# Patient Record
Sex: Female | Born: 1995 | Race: Black or African American | Hispanic: No | Marital: Married | Smoking: Never smoker
Health system: Southern US, Community
[De-identification: ages and names within clinical notes are randomized; demographics above are authoritative.]

## PROBLEM LIST (undated history)

## (undated) ENCOUNTER — Inpatient Hospital Stay (HOSPITAL_COMMUNITY): Payer: Self-pay

## (undated) DIAGNOSIS — F909 Attention-deficit hyperactivity disorder, unspecified type: Secondary | ICD-10-CM

## (undated) HISTORY — PX: MOUTH SURGERY: SHX715

## (undated) HISTORY — PX: COLONOSCOPY: SHX174

## (undated) HISTORY — PX: TUBAL LIGATION: SHX77

## (undated) HISTORY — PX: UPPER GI ENDOSCOPY: SHX6162

---

## 2016-12-25 LAB — GLUCOSE TOLERANCE, 1 HOUR (50G) W/O FASTING: Glucose, 1 Hour GTT: 108

## 2016-12-25 LAB — CYTOLOGY - PAP: PAP SMEAR: NEGATIVE

## 2016-12-26 LAB — OB RESULTS CONSOLE ANTIBODY SCREEN: Antibody Screen: NEGATIVE

## 2016-12-26 LAB — OB RESULTS CONSOLE ABO/RH: RH TYPE: POSITIVE

## 2016-12-26 LAB — OB RESULTS CONSOLE GC/CHLAMYDIA
Chlamydia: NEGATIVE
Gonorrhea: NEGATIVE

## 2016-12-26 LAB — OB RESULTS CONSOLE RUBELLA ANTIBODY, IGM: RUBELLA: IMMUNE

## 2016-12-26 LAB — OB RESULTS CONSOLE HGB/HCT, BLOOD
HCT: 37 %
Hemoglobin: 12.1 g/dL

## 2016-12-26 LAB — OB RESULTS CONSOLE TSH: TSH: 1068

## 2016-12-26 LAB — OB RESULTS CONSOLE PLATELET COUNT: Platelets: 358 10*3/uL

## 2016-12-26 LAB — OB RESULTS CONSOLE HIV ANTIBODY (ROUTINE TESTING): HIV: NONREACTIVE

## 2016-12-26 LAB — OB RESULTS CONSOLE HEPATITIS B SURFACE ANTIGEN: Hepatitis B Surface Ag: NEGATIVE

## 2016-12-26 LAB — OB RESULTS CONSOLE RPR: RPR: NONREACTIVE

## 2017-02-02 ENCOUNTER — Encounter (HOSPITAL_COMMUNITY): Payer: Self-pay | Admitting: *Deleted

## 2017-02-02 ENCOUNTER — Inpatient Hospital Stay (HOSPITAL_COMMUNITY)
Admission: AD | Admit: 2017-02-02 | Discharge: 2017-02-02 | Disposition: A | Discharge status: Home / Self Care | Payer: Medicaid Other | Source: Ambulatory Visit | Attending: Obstetrics & Gynecology | Admitting: Obstetrics & Gynecology

## 2017-02-02 DIAGNOSIS — O26892 Other specified pregnancy related conditions, second trimester: Secondary | ICD-10-CM | POA: Insufficient documentation

## 2017-02-02 DIAGNOSIS — Z3A24 24 weeks gestation of pregnancy: Secondary | ICD-10-CM | POA: Insufficient documentation

## 2017-02-02 DIAGNOSIS — A084 Viral intestinal infection, unspecified: Secondary | ICD-10-CM | POA: Diagnosis not present

## 2017-02-02 DIAGNOSIS — R509 Fever, unspecified: Secondary | ICD-10-CM | POA: Diagnosis not present

## 2017-02-02 DIAGNOSIS — O9989 Other specified diseases and conditions complicating pregnancy, childbirth and the puerperium: Secondary | ICD-10-CM | POA: Diagnosis not present

## 2017-02-02 DIAGNOSIS — Z9889 Other specified postprocedural states: Secondary | ICD-10-CM | POA: Insufficient documentation

## 2017-02-02 LAB — URINALYSIS, ROUTINE W REFLEX MICROSCOPIC
Bilirubin Urine: NEGATIVE
Glucose, UA: NEGATIVE mg/dL
Hgb urine dipstick: NEGATIVE
KETONES UR: NEGATIVE mg/dL
LEUKOCYTES UA: NEGATIVE
NITRITE: NEGATIVE
PH: 7 (ref 5.0–8.0)
PROTEIN: NEGATIVE mg/dL
Specific Gravity, Urine: 1.005 (ref 1.005–1.030)

## 2017-02-02 MED ORDER — ONDANSETRON 8 MG PO TBDP
8.0000 mg | ORAL_TABLET | Freq: Three times a day (TID) | ORAL | 0 refills | Status: DC | PRN
Start: 2017-02-02 — End: 2017-03-12

## 2017-02-02 MED ORDER — ONDANSETRON 8 MG PO TBDP: 8.0000 mg | ORAL_TABLET | Freq: Three times a day (TID) | ORAL | 0 refills | Status: DC | PRN

## 2017-02-02 MED ORDER — ONDANSETRON 8 MG PO TBDP
8.0000 mg | ORAL_TABLET | Freq: Once | ORAL | Status: AC
  Administered 2017-02-02: 8 mg via ORAL
  Filled 2017-02-02: qty 1

## 2017-02-02 MED ORDER — GI COCKTAIL ~~LOC~~
30.0000 mL | Freq: Once | ORAL | Status: AC
  Administered 2017-02-02: 30 mL via ORAL
  Filled 2017-02-02: qty 30

## 2017-02-02 NOTE — MAU Provider Note (Signed)
  History   G3P2002. Hx of two c-secitons. At 24 weeks 3 days. Presents with nausea, vomiting diarrhea for 2 days. Denies, fever, chills, vaginal bleeding or loss of fluid.   CSN: [id number]  Arrival date and time: 02/02/17 0859   None     Chief Complaint  Patient presents with  . Abdominal Cramping   HPI  OB History    Gravida Para Term Preterm AB Living   3 2 2  0 0 2   SAB TAB Ectopic Multiple Live Births   0       2      History reviewed. No pertinent past medical history.  Past Surgical History:  Procedure Laterality Date  . CESAREAN SECTION     x2    No family history on file.  Social History  Substance Use Topics  . Smoking status: Never Smoker  . Smokeless tobacco: Never Used  . Alcohol use No    Allergies: No Known Allergies  Prescriptions Prior to Admission  Medication Sig Dispense Refill Last Dose  . acetaminophen (TYLENOL) 500 MG tablet Take 1,000 mg by mouth every 6 (six) hours as needed for mild pain, moderate pain or headache.   02/02/2017 at 0400    Review of Systems  Constitutional: Negative.   HENT: Negative.   Eyes: Negative.   Respiratory: Negative.   Cardiovascular: Negative.   Gastrointestinal: Positive for abdominal pain, diarrhea, nausea and vomiting.  Endocrine: Negative.   Genitourinary: Negative.   Musculoskeletal: Negative.   Skin: Negative.   Allergic/Immunologic: Negative.   Neurological: Negative.   Hematological: Negative.   Psychiatric/Behavioral: Negative.    Physical Exam   Blood pressure 129/65, pulse 85, temperature 97.5 F (36.4 C), temperature source Oral, resp. rate 18, height 4\' 11"  (1.499 m), weight 98 kg (216 lb), SpO2 99 %.  Physical Exam  Constitutional: She is oriented to person, place, and time. She appears well-developed and well-nourished.  HENT:  Head: Normocephalic and atraumatic.  Cardiovascular: Normal rate, regular rhythm and normal heart sounds.   Respiratory: Effort normal and breath sounds  normal.  GI: Soft.  Genitourinary: Vagina normal and uterus normal.  Musculoskeletal: Normal range of motion.  Neurological: She is alert and oriented to person, place, and time.  Skin: Skin is warm and dry.  Psychiatric: She has a normal mood and affect. Her behavior is normal. Judgment and thought content normal.    MAU Course  Procedures  MDM Viral gastroenteritis  Assessment and Plan  SVE closed/thick/-3  Able to retain fluids with oral Zofran, will discharge home with Rx.   Baird KayKathryn Manchester 02/02/2017, 10:29 AM

## 2017-02-02 NOTE — MAU Note (Signed)
3 days ago started with diarrhea with every bathroom trip.  Unable to sleep well.  On Saturday really frustrated and tired and started crying and noticed a sharp chest pain that comes and goes.   The belly cramping started this am.  Nausea started this am  Denies any vomiting. Denies LOF, Vaginal bleeding

## 2017-02-13 ENCOUNTER — Encounter: Payer: Self-pay | Admitting: *Deleted

## 2017-02-19 ENCOUNTER — Encounter: Payer: Self-pay | Admitting: *Deleted

## 2017-02-26 ENCOUNTER — Encounter: Payer: Self-pay | Admitting: *Deleted

## 2017-02-28 ENCOUNTER — Encounter: Payer: Self-pay | Admitting: *Deleted

## 2017-02-28 DIAGNOSIS — Z8619 Personal history of other infectious and parasitic diseases: Secondary | ICD-10-CM | POA: Insufficient documentation

## 2017-02-28 DIAGNOSIS — Z8279 Family history of other congenital malformations, deformations and chromosomal abnormalities: Secondary | ICD-10-CM | POA: Insufficient documentation

## 2017-03-03 ENCOUNTER — Encounter: Payer: Medicaid Other | Admitting: Family Medicine

## 2017-03-03 ENCOUNTER — Encounter: Payer: Self-pay | Admitting: Family Medicine

## 2017-03-03 ENCOUNTER — Ambulatory Visit (INDEPENDENT_AMBULATORY_CARE_PROVIDER_SITE_OTHER): Payer: Medicaid Other | Admitting: Family Medicine

## 2017-03-03 VITALS — BP 117/65 | HR 84 | Wt 224.9 lb

## 2017-03-03 DIAGNOSIS — O99210 Obesity complicating pregnancy, unspecified trimester: Secondary | ICD-10-CM

## 2017-03-03 DIAGNOSIS — Z3490 Encounter for supervision of normal pregnancy, unspecified, unspecified trimester: Secondary | ICD-10-CM | POA: Insufficient documentation

## 2017-03-03 DIAGNOSIS — Z8279 Family history of other congenital malformations, deformations and chromosomal abnormalities: Secondary | ICD-10-CM

## 2017-03-03 DIAGNOSIS — O99213 Obesity complicating pregnancy, third trimester: Secondary | ICD-10-CM

## 2017-03-03 DIAGNOSIS — O34219 Maternal care for unspecified type scar from previous cesarean delivery: Secondary | ICD-10-CM | POA: Diagnosis not present

## 2017-03-03 DIAGNOSIS — Z3400 Encounter for supervision of normal first pregnancy, unspecified trimester: Secondary | ICD-10-CM

## 2017-03-03 NOTE — Progress Notes (Signed)
Pt declines tdap vaccine

## 2017-03-03 NOTE — Progress Notes (Signed)
   PRENATAL VISIT NOTE  Subjective:  Kaitlyn Irwin is a 21 y.o. G3P2002 at 1660w4d being seen today for transferring prenatal care.  She is currently monitored for the following issues for this low-risk pregnancy and has History of group B Streptococcus (GBS) infection; Family history of Downs syndrome; Supervision of normal pregnancy; Maternal morbid obesity, antepartum (HCC); and Previous cesarean delivery affecting pregnancy, antepartum on her problem list.  Patient reports no complaints.  Contractions: Not present. Vag. Bleeding: None.  Movement: Present. Denies leaking of fluid.   The following portions of the patient's history were reviewed and updated as appropriate: allergies, current medications, past family history, past medical history, past social history, past surgical history and problem list. Problem list updated.  Objective:   Vitals:   03/03/17 1411  BP: 117/65  Pulse: 84  Weight: 224 lb 14.4 oz (102 kg)    Fetal Status: Fetal Heart Rate (bpm): 136 Fundal Height: 32 cm Movement: Present     General:  Alert, oriented and cooperative. Patient is in no acute distress.  Skin: Skin is warm and dry. No rash noted.   Cardiovascular: Normal heart rate noted  Respiratory: Normal respiratory effort, no problems with respiration noted  Abdomen: Soft, gravid, appropriate for gestational age. Pain/Pressure: Present     Pelvic:  Cervical exam deferred        Extremities: Normal range of motion.  Edema: None  Mental Status: Normal mood and affect. Normal behavior. Normal judgment and thought content.   Assessment and Plan:  Pregnancy: G3P2002 at 6160w4d  1. Supervision of normal first pregnancy, antepartum Continue routine prenatal care. Needs anatomy scheduled. - US MFM OB COMP + 14 WK; Future  2. Maternal morbid obesity, antepartum (HCC) 2 hour scheduled--not fasting today  3. Previous cesarean delivery affecting pregnancy, antepartum x2--desires TOLAC--risks  reviewed--Consent signed. BTL consent signed as well.  Preterm labor symptoms and general obstetric precautions including but not limited to vaginal bleeding, contractions, leaking of fluid and fetal movement were reviewed in detail with the patient. Please refer to After Visit Summary for other counseling recommendations.  Return in 2 weeks (on 03/17/2017).   Reva Boresanya S Sujey Gundry, MD

## 2017-03-03 NOTE — Patient Instructions (Signed)
 Second Trimester of Pregnancy The second trimester is from week 14 through week 27 (months 4 through 6). The second trimester is often a time when you feel your best. Your body has adjusted to being pregnant, and you begin to feel better physically. Usually, morning sickness has lessened or quit completely, you may have more energy, and you may have an increase in appetite. The second trimester is also a time when the fetus is growing rapidly. At the end of the sixth month, the fetus is about 9 inches long and weighs about 1 pounds. You will likely begin to feel the baby move (quickening) between 16 and 20 weeks of pregnancy. Body changes during your second trimester Your body continues to go through many changes during your second trimester. The changes vary from woman to woman.  Your weight will continue to increase. You will notice your lower abdomen bulging out.  You may begin to get stretch marks on your hips, abdomen, and breasts.  You may develop headaches that can be relieved by medicines. The medicines should be approved by your health care provider.  You may urinate more often because the fetus is pressing on your bladder.  You may develop or continue to have heartburn as a result of your pregnancy.  You may develop constipation because certain hormones are causing the muscles that push waste through your intestines to slow down.  You may develop hemorrhoids or swollen, bulging veins (varicose veins).  You may have back pain. This is caused by: ? Weight gain. ? Pregnancy hormones that are relaxing the joints in your pelvis. ? A shift in weight and the muscles that support your balance.  Your breasts will continue to grow and they will continue to become tender.  Your gums may bleed and may be sensitive to brushing and flossing.  Dark spots or blotches (chloasma, mask of pregnancy) may develop on your face. This will likely fade after the baby is born.  A dark line from  your belly button to the pubic area (linea nigra) may appear. This will likely fade after the baby is born.  You may have changes in your hair. These can include thickening of your hair, rapid growth, and changes in texture. Some women also have hair loss during or after pregnancy, or hair that feels dry or thin. Your hair will most likely return to normal after your baby is born.  What to expect at prenatal visits During a routine prenatal visit:  You will be weighed to make sure you and the fetus are growing normally.  Your blood pressure will be taken.  Your abdomen will be measured to track your baby's growth.  The fetal heartbeat will be listened to.  Any test results from the previous visit will be discussed.  Your health care provider may ask you:  How you are feeling.  If you are feeling the baby move.  If you have had any abnormal symptoms, such as leaking fluid, bleeding, severe headaches, or abdominal cramping.  If you are using any tobacco products, including cigarettes, chewing tobacco, and electronic cigarettes.  If you have any questions.  Other tests that may be performed during your second trimester include:  Blood tests that check for: ? Low iron levels (anemia). ? High blood sugar that affects pregnant women (gestational diabetes) between 24 and 28 weeks. ? Rh antibodies. This is to check for a protein on red blood cells (Rh factor).  Urine tests to check for infections, diabetes,   or protein in the urine.  An ultrasound to confirm the proper growth and development of the baby.  An amniocentesis to check for possible genetic problems.  Fetal screens for spina bifida and Down syndrome.  HIV (human immunodeficiency virus) testing. Routine prenatal testing includes screening for HIV, unless you choose not to have this test.  Follow these instructions at home: Medicines  Follow your health care provider's instructions regarding medicine use. Specific  medicines may be either safe or unsafe to take during pregnancy.  Take a prenatal vitamin that contains at least 600 micrograms (mcg) of folic acid.  If you develop constipation, try taking a stool softener if your health care provider approves. Eating and drinking  Eat a balanced diet that includes fresh fruits and vegetables, whole grains, good sources of protein such as meat, eggs, or tofu, and low-fat dairy. Your health care provider will help you determine the amount of weight gain that is right for you.  Avoid raw meat and uncooked cheese. These carry germs that can cause birth defects in the baby.  If you have low calcium intake from food, talk to your health care provider about whether you should take a daily calcium supplement.  Limit foods that are high in fat and processed sugars, such as fried and sweet foods.  To prevent constipation: ? Drink enough fluid to keep your urine clear or pale yellow. ? Eat foods that are high in fiber, such as fresh fruits and vegetables, whole grains, and beans. Activity  Exercise only as directed by your health care provider. Most women can continue their usual exercise routine during pregnancy. Try to exercise for 30 minutes at least 5 days a week. Stop exercising if you experience uterine contractions.  Avoid heavy lifting, wear low heel shoes, and practice good posture.  A sexual relationship may be continued unless your health care provider directs you otherwise. Relieving pain and discomfort  Wear a good support bra to prevent discomfort from breast tenderness.  Take warm sitz baths to soothe any pain or discomfort caused by hemorrhoids. Use hemorrhoid cream if your health care provider approves.  Rest with your legs elevated if you have leg cramps or low back pain.  If you develop varicose veins, wear support hose. Elevate your feet for 15 minutes, 3-4 times a day. Limit salt in your diet. Prenatal Care  Write down your questions.  Take them to your prenatal visits.  Keep all your prenatal visits as told by your health care provider. This is important. Safety  Wear your seat belt at all times when driving.  Make a list of emergency phone numbers, including numbers for family, friends, the hospital, and police and fire departments. General instructions  Ask your health care provider for a referral to a local prenatal education class. Begin classes no later than the beginning of month 6 of your pregnancy.  Ask for help if you have counseling or nutritional needs during pregnancy. Your health care provider can offer advice or refer you to specialists for help with various needs.  Do not use hot tubs, steam rooms, or saunas.  Do not douche or use tampons or scented sanitary pads.  Do not cross your legs for long periods of time.  Avoid cat litter boxes and soil used by cats. These carry germs that can cause birth defects in the baby and possibly loss of the fetus by miscarriage or stillbirth.  Avoid all smoking, herbs, alcohol, and unprescribed drugs. Chemicals in these products   can affect the formation and growth of the baby.  Do not use any products that contain nicotine or tobacco, such as cigarettes and e-cigarettes. If you need help quitting, ask your health care provider.  Visit your dentist if you have not gone yet during your pregnancy. Use a soft toothbrush to brush your teeth and be gentle when you floss. Contact a health care provider if:  You have dizziness.  You have mild pelvic cramps, pelvic pressure, or nagging pain in the abdominal area.  You have persistent nausea, vomiting, or diarrhea.  You have a bad smelling vaginal discharge.  You have pain when you urinate. Get help right away if:  You have a fever.  You are leaking fluid from your vagina.  You have spotting or bleeding from your vagina.  You have severe abdominal cramping or pain.  You have rapid weight gain or weight  loss.  You have shortness of breath with chest pain.  You notice sudden or extreme swelling of your face, hands, ankles, feet, or legs.  You have not felt your baby move in over an hour.  You have severe headaches that do not go away when you take medicine.  You have vision changes. Summary  The second trimester is from week 14 through week 27 (months 4 through 6). It is also a time when the fetus is growing rapidly.  Your body goes through many changes during pregnancy. The changes vary from woman to woman.  Avoid all smoking, herbs, alcohol, and unprescribed drugs. These chemicals affect the formation and growth your baby.  Do not use any tobacco products, such as cigarettes, chewing tobacco, and e-cigarettes. If you need help quitting, ask your health care provider.  Contact your health care provider if you have any questions. Keep all prenatal visits as told by your health care provider. This is important. This information is not intended to replace advice given to you by your health care provider. Make sure you discuss any questions you have with your health care provider. Document Released: 12/03/2001 Document Revised: 05/16/2016 Document Reviewed: 02/09/2013 [employer] Interactive Patient Education  2017 [employer]   Breastfeeding Deciding to breastfeed is one of the best choices you can make for you and your baby. A change in hormones during pregnancy causes your breast tissue to grow and increases the number and size of your milk ducts. These hormones also allow proteins, sugars, and fats from your blood supply to make breast milk in your milk-producing glands. Hormones prevent breast milk from being released before your baby is born as well as prompt milk flow after birth. Once breastfeeding has begun, thoughts of your baby, as well as his or her sucking or crying, can stimulate the release of milk from your milk-producing glands. Benefits of breastfeeding For Your  Baby  Your first milk (colostrum) helps your baby's digestive system function better.  There are antibodies in your milk that help your baby fight off infections.  Your baby has a lower incidence of asthma, allergies, and sudden infant death syndrome.  The nutrients in breast milk are better for your baby than infant formulas and are designed uniquely for your baby's needs.  Breast milk improves your baby's brain development.  Your baby is less likely to develop other conditions, such as childhood obesity, asthma, or type 2 diabetes mellitus.  For You  Breastfeeding helps to create a very special bond between you and your baby.  Breastfeeding is convenient. Breast milk is always available at   the correct temperature and costs nothing.  Breastfeeding helps to burn calories and helps you lose the weight gained during pregnancy.  Breastfeeding makes your uterus contract to its prepregnancy size faster and slows bleeding (lochia) after you give birth.  Breastfeeding helps to lower your risk of developing type 2 diabetes mellitus, osteoporosis, and breast or ovarian cancer later in life.  Signs that your baby is hungry Early Signs of Hunger  Increased alertness or activity.  Stretching.  Movement of the head from side to side.  Movement of the head and opening of the mouth when the corner of the mouth or cheek is stroked (rooting).  Increased sucking sounds, smacking lips, cooing, sighing, or squeaking.  Hand-to-mouth movements.  Increased sucking of fingers or hands.  Late Signs of Hunger  Fussing.  Intermittent crying.  Extreme Signs of Hunger Signs of extreme hunger will require calming and consoling before your baby will be able to breastfeed successfully. Do not wait for the following signs of extreme hunger to occur before you initiate breastfeeding:  Restlessness.  A loud, strong cry.  Screaming.  Breastfeeding basics Breastfeeding Initiation  Find a  comfortable place to sit or lie down, with your neck and back well supported.  Place a pillow or rolled up blanket under your baby to bring him or her to the level of your breast (if you are seated). Nursing pillows are specially designed to help support your arms and your baby while you breastfeed.  Make sure that your baby's abdomen is facing your abdomen.  Gently massage your breast. With your fingertips, massage from your chest wall toward your nipple in a circular motion. This encourages milk flow. You may need to continue this action during the feeding if your milk flows slowly.  Support your breast with 4 fingers underneath and your thumb above your nipple. Make sure your fingers are well away from your nipple and your baby's mouth.  Stroke your baby's lips gently with your finger or nipple.  When your baby's mouth is open wide enough, quickly bring your baby to your breast, placing your entire nipple and as much of the colored area around your nipple (areola) as possible into your baby's mouth. ? More areola should be visible above your baby's upper lip than below the lower lip. ? Your baby's tongue should be between his or her lower gum and your breast.  Ensure that your baby's mouth is correctly positioned around your nipple (latched). Your baby's lips should create a seal on your breast and be turned out (everted).  It is common for your baby to suck about 2-3 minutes in order to start the flow of breast milk.  Latching Teaching your baby how to latch on to your breast properly is very important. An improper latch can cause nipple pain and decreased milk supply for you and poor weight gain in your baby. Also, if your baby is not latched onto your nipple properly, he or she may swallow some air during feeding. This can make your baby fussy. Burping your baby when you switch breasts during the feeding can help to get rid of the air. However, teaching your baby to latch on properly is  still the best way to prevent fussiness from swallowing air while breastfeeding. Signs that your baby has successfully latched on to your nipple:  Silent tugging or silent sucking, without causing you pain.  Swallowing heard between every 3-4 sucks.  Muscle movement above and in front of his or her   ears while sucking.  Signs that your baby has not successfully latched on to nipple:  Sucking sounds or smacking sounds from your baby while breastfeeding.  Nipple pain.  If you think your baby has not latched on correctly, slip your finger into the corner of your baby's mouth to break the suction and place it between your baby's gums. Attempt breastfeeding initiation again. Signs of Successful Breastfeeding Signs from your baby:  A gradual decrease in the number of sucks or complete cessation of sucking.  Falling asleep.  Relaxation of his or her body.  Retention of a small amount of milk in his or her mouth.  Letting go of your breast by himself or herself.  Signs from you:  Breasts that have increased in firmness, weight, and size 1-3 hours after feeding.  Breasts that are softer immediately after breastfeeding.  Increased milk volume, as well as a change in milk consistency and color by the fifth day of breastfeeding.  Nipples that are not sore, cracked, or bleeding.  Signs That Your Baby is Getting Enough Milk  Wetting at least 1-2 diapers during the first 24 hours after birth.  Wetting at least 5-6 diapers every 24 hours for the first week after birth. The urine should be clear or pale yellow by 5 days after birth.  Wetting 6-8 diapers every 24 hours as your baby continues to grow and develop.  At least 3 stools in a 24-hour period by age 5 days. The stool should be soft and yellow.  At least 3 stools in a 24-hour period by age 7 days. The stool should be seedy and yellow.  No loss of weight greater than 10% of birth weight during the first 3 days of age.  Average  weight gain of 4-7 ounces (113-198 g) per week after age 4 days.  Consistent daily weight gain by age 5 days, without weight loss after the age of 2 weeks.  After a feeding, your baby may spit up a small amount. This is common. Breastfeeding frequency and duration Frequent feeding will help you make more milk and can prevent sore nipples and breast engorgement. Breastfeed when you feel the need to reduce the fullness of your breasts or when your baby shows signs of hunger. This is called "breastfeeding on demand." Avoid introducing a pacifier to your baby while you are working to establish breastfeeding (the first 4-6 weeks after your baby is born). After this time you may choose to use a pacifier. Research has shown that pacifier use during the first year of a baby's life decreases the risk of sudden infant death syndrome (SIDS). Allow your baby to feed on each breast as long as he or she wants. Breastfeed until your baby is finished feeding. When your baby unlatches or falls asleep while feeding from the first breast, offer the second breast. Because newborns are often sleepy in the first few weeks of life, you may need to awaken your baby to get him or her to feed. Breastfeeding times will vary from baby to baby. However, the following rules can serve as a guide to help you ensure that your baby is properly fed:  Newborns (babies 4 weeks of age or younger) may breastfeed every 1-3 hours.  Newborns should not go longer than 3 hours during the day or 5 hours during the night without breastfeeding.  You should breastfeed your baby a minimum of 8 times in a 24-hour period until you begin to introduce solid foods to your   baby at around 6 months of age.  Breast milk pumping Pumping and storing breast milk allows you to ensure that your baby is exclusively fed your breast milk, even at times when you are unable to breastfeed. This is especially important if you are going back to work while you are still  breastfeeding or when you are not able to be present during feedings. Your lactation consultant can give you guidelines on how long it is safe to store breast milk. A breast pump is a machine that allows you to pump milk from your breast into a sterile bottle. The pumped breast milk can then be stored in a refrigerator or freezer. Some breast pumps are operated by hand, while others use electricity. Ask your lactation consultant which type will work best for you. Breast pumps can be purchased, but some hospitals and breastfeeding support groups lease breast pumps on a monthly basis. A lactation consultant can teach you how to hand express breast milk, if you prefer not to use a pump. Caring for your breasts while you breastfeed Nipples can become dry, cracked, and sore while breastfeeding. The following recommendations can help keep your breasts moisturized and healthy:  Avoid using soap on your nipples.  Wear a supportive bra. Although not required, special nursing bras and tank tops are designed to allow access to your breasts for breastfeeding without taking off your entire bra or top. Avoid wearing underwire-style bras or extremely tight bras.  Air dry your nipples for 3-4minutes after each feeding.  Use only cotton bra pads to absorb leaked breast milk. Leaking of breast milk between feedings is normal.  Use lanolin on your nipples after breastfeeding. Lanolin helps to maintain your skin's normal moisture barrier. If you use pure lanolin, you do not need to wash it off before feeding your baby again. Pure lanolin is not toxic to your baby. You may also hand express a few drops of breast milk and gently massage that milk into your nipples and allow the milk to air dry.  In the first few weeks after giving birth, some women experience extremely full breasts (engorgement). Engorgement can make your breasts feel heavy, warm, and tender to the touch. Engorgement peaks within 3-5 days after you give  birth. The following recommendations can help ease engorgement:  Completely empty your breasts while breastfeeding or pumping. You may want to start by applying warm, moist heat (in the shower or with warm water-soaked hand towels) just before feeding or pumping. This increases circulation and helps the milk flow. If your baby does not completely empty your breasts while breastfeeding, pump any extra milk after he or she is finished.  Wear a snug bra (nursing or regular) or tank top for 1-2 days to signal your body to slightly decrease milk production.  Apply ice packs to your breasts, unless this is too uncomfortable for you.  Make sure that your baby is latched on and positioned properly while breastfeeding.  If engorgement persists after 48 hours of following these recommendations, contact your health care provider or a lactation consultant. Overall health care recommendations while breastfeeding  Eat healthy foods. Alternate between meals and snacks, eating 3 of each per day. Because what you eat affects your breast milk, some of the foods may make your baby more irritable than usual. Avoid eating these foods if you are sure that they are negatively affecting your baby.  Drink milk, fruit juice, and water to satisfy your thirst (about 10 glasses a day).    Rest often, relax, and continue to take your prenatal vitamins to prevent fatigue, stress, and anemia.  Continue breast self-awareness checks.  Avoid chewing and smoking tobacco. Chemicals from cigarettes that pass into breast milk and exposure to secondhand smoke may harm your baby.  Avoid alcohol and drug use, including marijuana. Some medicines that may be harmful to your baby can pass through breast milk. It is important to ask your health care provider before taking any medicine, including all over-the-counter and prescription medicine as well as vitamin and herbal supplements. It is possible to become pregnant while breastfeeding.  If birth control is desired, ask your health care provider about options that will be safe for your baby. Contact a health care provider if:  You feel like you want to stop breastfeeding or have become frustrated with breastfeeding.  You have painful breasts or nipples.  Your nipples are cracked or bleeding.  Your breasts are red, tender, or warm.  You have a swollen area on either breast.  You have a fever or chills.  You have nausea or vomiting.  You have drainage other than breast milk from your nipples.  Your breasts do not become full before feedings by the fifth day after you give birth.  You feel sad and depressed.  Your baby is too sleepy to eat well.  Your baby is having trouble sleeping.  Your baby is wetting less than 3 diapers in a 24-hour period.  Your baby has less than 3 stools in a 24-hour period.  Your baby's skin or the white part of his or her eyes becomes yellow.  Your baby is not gaining weight by 5 days of age. Get help right away if:  Your baby is overly tired (lethargic) and does not want to wake up and feed.  Your baby develops an unexplained fever. This information is not intended to replace advice given to you by your health care provider. Make sure you discuss any questions you have with your health care provider. Document Released: 12/09/2005 Document Revised: 05/22/2016 Document Reviewed: 06/02/2013 [employer] Interactive Patient Education  2017 [employer]  

## 2017-03-04 ENCOUNTER — Encounter: Payer: Self-pay | Admitting: *Deleted

## 2017-03-10 ENCOUNTER — Encounter (HOSPITAL_COMMUNITY): Payer: Self-pay | Admitting: Family Medicine

## 2017-03-12 ENCOUNTER — Encounter (HOSPITAL_COMMUNITY): Payer: Self-pay | Admitting: *Deleted

## 2017-03-12 ENCOUNTER — Inpatient Hospital Stay (HOSPITAL_COMMUNITY)
Admission: AD | Admit: 2017-03-12 | Discharge: 2017-03-12 | Disposition: A | Discharge status: Home / Self Care | Payer: Medicaid Other | Source: Ambulatory Visit | Attending: Obstetrics & Gynecology | Admitting: Obstetrics & Gynecology

## 2017-03-12 DIAGNOSIS — O99820 Streptococcus B carrier state complicating pregnancy: Secondary | ICD-10-CM | POA: Diagnosis not present

## 2017-03-12 DIAGNOSIS — O4703 False labor before 37 completed weeks of gestation, third trimester: Secondary | ICD-10-CM | POA: Insufficient documentation

## 2017-03-12 DIAGNOSIS — Z8279 Family history of other congenital malformations, deformations and chromosomal abnormalities: Secondary | ICD-10-CM | POA: Insufficient documentation

## 2017-03-12 DIAGNOSIS — O34211 Maternal care for low transverse scar from previous cesarean delivery: Secondary | ICD-10-CM | POA: Diagnosis not present

## 2017-03-12 DIAGNOSIS — Z8619 Personal history of other infectious and parasitic diseases: Secondary | ICD-10-CM

## 2017-03-12 DIAGNOSIS — Z3A29 29 weeks gestation of pregnancy: Secondary | ICD-10-CM | POA: Insufficient documentation

## 2017-03-12 DIAGNOSIS — O479 False labor, unspecified: Secondary | ICD-10-CM | POA: Diagnosis not present

## 2017-03-12 DIAGNOSIS — R1030 Lower abdominal pain, unspecified: Secondary | ICD-10-CM | POA: Diagnosis present

## 2017-03-12 DIAGNOSIS — O34219 Maternal care for unspecified type scar from previous cesarean delivery: Secondary | ICD-10-CM

## 2017-03-12 LAB — URINALYSIS, ROUTINE W REFLEX MICROSCOPIC
Bilirubin Urine: NEGATIVE
GLUCOSE, UA: NEGATIVE mg/dL
HGB URINE DIPSTICK: NEGATIVE
Ketones, ur: 5 mg/dL — AB
Leukocytes, UA: NEGATIVE
Nitrite: NEGATIVE
Protein, ur: NEGATIVE mg/dL
SPECIFIC GRAVITY, URINE: 1.024 (ref 1.005–1.030)
pH: 6 (ref 5.0–8.0)

## 2017-03-12 LAB — WET PREP, GENITAL
Clue Cells Wet Prep HPF POC: NONE SEEN
SPERM: NONE SEEN
Trich, Wet Prep: NONE SEEN
WBC, Wet Prep HPF POC: NONE SEEN
Yeast Wet Prep HPF POC: NONE SEEN

## 2017-03-12 LAB — FETAL FIBRONECTIN: Fetal Fibronectin: NEGATIVE

## 2017-03-12 MED ORDER — NIFEDIPINE 10 MG PO CAPS
10.0000 mg | ORAL_CAPSULE | ORAL | Status: DC | PRN
Start: 2017-03-12 — End: 2017-03-12
  Administered 2017-03-12: 10 mg via ORAL
  Filled 2017-03-12: qty 1

## 2017-03-12 NOTE — MAU Note (Signed)
Pt present with c/o lower abdominal pain & pressure that began last night @ 2000.  Denies VB or leaking of fluid. Last coitus 03/10/17.

## 2017-03-12 NOTE — Discharge Instructions (Signed)

## 2017-03-12 NOTE — MAU Note (Signed)
Pt C/O pelvic pressure & abd cramping/contractions since 2045 last night.  Denies bleeding or LOF.

## 2017-03-12 NOTE — MAU Provider Note (Signed)
PAtient Kaitlyn Irwin is a 21 year old G3P2002 here at 29 weeks and 6 days here with contractions that started this morning at 0800. She denies bleeding, leaking of fluid or decreased fetal movements. Her history is significant for C-section times 2; planning to VBAC.  History     CSN: [id number]  Arrival date and time: 03/12/17 1136   None     Chief Complaint  Patient presents with  . Abdominal Pain  . pelvic pressure   Abdominal Pain  This is a new problem. The current episode started today. The onset quality is sudden. The problem occurs intermittently. The problem has been unchanged. The pain is located in the suprapubic region, LLQ and RLQ. The pain is at a severity of 8/10. The quality of the pain is cramping. The abdominal pain does not radiate. Pertinent negatives include no arthralgias, belching, constipation, diarrhea, dysuria, frequency, headaches, hematochezia, hematuria, nausea or vomiting. Nothing aggravates the pain. The pain is relieved by nothing. Her past medical history is significant for abdominal surgery.    OB History    Gravida Para Term Preterm AB Living   3 2 2  0 0 2   SAB TAB Ectopic Multiple Live Births   0       2      History reviewed. No pertinent past medical history.  Past Surgical History:  Procedure Laterality Date  . CESAREAN SECTION     x2    Family History  Problem Relation Age of Onset  . Hypertension Father   . Diabetes Paternal Aunt   . Hypertension Paternal Aunt     Social History  Substance Use Topics  . Smoking status: Never Smoker  . Smokeless tobacco: Never Used  . Alcohol use No    Allergies: No Known Allergies  Prescriptions Prior to Admission  Medication Sig Dispense Refill Last Dose  . acetaminophen (TYLENOL) 500 MG tablet Take 1,000 mg by mouth every 6 (six) hours as needed for mild pain, moderate pain or headache.   Not Taking  . ondansetron (ZOFRAN ODT) 8 MG disintegrating tablet Take 1 tablet (8 mg total) by  mouth every 8 (eight) hours as needed for nausea or vomiting. (Patient not taking: Reported on 03/03/2017) 20 tablet 0 Not Taking  . Prenatal Vit-Fe Fumarate-FA (PRENATAL VITAMINS) 28-0.8 MG TABS Take 1 tablet by mouth.   Taking    Review of Systems  Constitutional: Negative.   HENT: Negative.   Eyes: Negative.   Respiratory: Negative.   Gastrointestinal: Positive for abdominal pain. Negative for constipation, diarrhea, hematochezia, nausea and vomiting.  Endocrine: Negative.   Genitourinary: Negative for dysuria, frequency and hematuria.  Musculoskeletal: Negative for arthralgias.  Allergic/Immunologic: Negative.   Neurological: Negative for headaches.  Hematological: Negative.   Psychiatric/Behavioral: Negative.    Physical Exam   Blood pressure 127/67, pulse (!) 111, temperature 97.7 F (36.5 C), temperature source Oral, resp. rate 18, last menstrual period 08/15/2016.  Physical Exam  Constitutional: She is oriented to person, place, and time. She appears well-developed and well-nourished.  HENT:  Head: Normocephalic.  Neck: Normal range of motion.  Respiratory: Effort normal.  GI: Soft. She exhibits no distension and no mass. There is no tenderness. There is no rebound and no guarding.  Genitourinary:  Genitourinary Comments: NEFG; cervix is closed, long and thick. No CMT. No adnexal or suprapubic tenderness.   Musculoskeletal: Normal range of motion.  Neurological: She is alert and oriented to person, place, and time.  Skin: Skin is warm  and dry.    MAU Course  Procedures  MDM -NST: reactive, occasional contractions, FHR is 145 with occasional tracing of maternal pulse; no decelerations, present accelerations and moderate variabiliyt.  -ffn-negative -UA -GC CT pending -10 mg of procardia PO Patient has had only occasional contractions during her MAU stay. While speaking with her I noticed patient had two contractions on the monitor but patient did not feel them.  Patient did not appear in any distress; she was resting in bed with her family at the bedside.  Assessment and Plan   1. Braxton Hick's contraction   2. Previous cesarean delivery affecting pregnancy, antepartum   3. History of group B Streptococcus (GBS) infection   4. Family history of Downs syndrome    2. Patient stable for discharge with recommendations to keep her ob appt on 03-17-3017. 3. Reviewed warning signs and when to return to the MAU (bleeding, leaking of fluid, decreased fetal movements).   Charlesetta Garibaldi Shandel Busic CNM 03/12/2017, 1:07 PM

## 2017-03-13 LAB — GC/CHLAMYDIA PROBE AMP (~~LOC~~) NOT AT ARMC
CHLAMYDIA, DNA PROBE: NEGATIVE
Neisseria Gonorrhea: NEGATIVE

## 2017-03-17 ENCOUNTER — Ambulatory Visit (HOSPITAL_COMMUNITY)
Admission: RE | Admit: 2017-03-17 | Discharge: 2017-03-17 | Disposition: A | Discharge status: Home / Self Care | Payer: Medicaid Other | Source: Ambulatory Visit | Attending: Family Medicine | Admitting: Family Medicine

## 2017-03-17 ENCOUNTER — Other Ambulatory Visit: Payer: Self-pay | Admitting: Family Medicine

## 2017-03-17 ENCOUNTER — Encounter: Payer: Medicaid Other | Admitting: Family Medicine

## 2017-03-17 DIAGNOSIS — O34211 Maternal care for low transverse scar from previous cesarean delivery: Secondary | ICD-10-CM | POA: Insufficient documentation

## 2017-03-17 DIAGNOSIS — Z3400 Encounter for supervision of normal first pregnancy, unspecified trimester: Secondary | ICD-10-CM

## 2017-03-17 DIAGNOSIS — Z3A30 30 weeks gestation of pregnancy: Secondary | ICD-10-CM | POA: Insufficient documentation

## 2017-03-17 DIAGNOSIS — Z3689 Encounter for other specified antenatal screening: Secondary | ICD-10-CM | POA: Diagnosis present

## 2017-03-17 DIAGNOSIS — O99213 Obesity complicating pregnancy, third trimester: Secondary | ICD-10-CM | POA: Diagnosis not present

## 2017-03-18 ENCOUNTER — Encounter: Payer: Medicaid Other | Admitting: Family Medicine

## 2017-03-24 ENCOUNTER — Encounter: Payer: Medicaid Other | Admitting: Student

## 2017-03-24 ENCOUNTER — Encounter: Payer: Self-pay | Admitting: Student

## 2017-03-24 ENCOUNTER — Ambulatory Visit (INDEPENDENT_AMBULATORY_CARE_PROVIDER_SITE_OTHER): Payer: Medicaid Other | Admitting: Student

## 2017-03-24 VITALS — BP 115/58 | HR 96 | Wt 226.2 lb

## 2017-03-24 DIAGNOSIS — O99213 Obesity complicating pregnancy, third trimester: Secondary | ICD-10-CM | POA: Diagnosis present

## 2017-03-24 DIAGNOSIS — O99210 Obesity complicating pregnancy, unspecified trimester: Secondary | ICD-10-CM

## 2017-03-24 DIAGNOSIS — Z3493 Encounter for supervision of normal pregnancy, unspecified, third trimester: Secondary | ICD-10-CM

## 2017-03-24 DIAGNOSIS — Z23 Encounter for immunization: Secondary | ICD-10-CM | POA: Diagnosis not present

## 2017-03-24 NOTE — Patient Instructions (Addendum)

## 2017-03-24 NOTE — Progress Notes (Signed)
   PRENATAL VISIT NOTE  Subjective:  Kaitlyn Irwin is a 21 y.o. G3P2002 at [redacted]w[redacted]d being seen today for ongoing prenatal care.  She is currently monitored for the following issues for this high-risk pregnancy and has History of group B Streptococcus (GBS) infection; Family history of Downs syndrome; Supervision of normal pregnancy; Maternal morbid obesity, antepartum (HCC); and Previous cesarean delivery affecting pregnancy, antepartum on her problem list.  Patient reports no complaints.  Contractions: Not present. Vag. Bleeding: None.  Movement: Present. Denies leaking of fluid.   The following portions of the patient's history were reviewed and updated as appropriate: allergies, current medications, past family history, past medical history, past social history, past surgical history and problem list. Problem list updated.  Objective:   Vitals:   03/24/17 0808  BP: (!) 115/58  Pulse: 96  Weight: 226 lb 3.2 oz (102.6 kg)    Fetal Status: Fetal Heart Rate (bpm): 135 Fundal Height: 34 cm Movement: Present     General:  Alert, oriented and cooperative. Patient is in no acute distress.  Skin: Skin is warm and dry. No rash noted.   Cardiovascular: Normal heart rate noted  Respiratory: Normal respiratory effort, no problems with respiration noted  Abdomen: Soft, gravid, appropriate for gestational age. Pain/Pressure: Present     Pelvic:  Cervical exam deferred        Extremities: Normal range of motion.  Edema: Trace  Mental Status: Normal mood and affect. Normal behavior. Normal judgment and thought content.   Assessment and Plan:  Pregnancy: G3P2002 at [redacted]w[redacted]d  1. Encounter for supervision of normal pregnancy in third trimester, unspecified gravidity  - Glucose Tolerance, 2 Hours w/1 Hour - CBC - HIV antibody - RPR - Tdap vaccine greater than or equal to 7yo IM  2. Maternal morbid obesity, antepartum (HCC) -Fundal height larger than dates -- ultrasound on 3/27, EFW 52nd%  Preterm  labor symptoms and general obstetric precautions including but not limited to vaginal bleeding, contractions, leaking of fluid and fetal movement were reviewed in detail with the patient. Please refer to After Visit Summary for other counseling recommendations.  Return in about 2 weeks (around 04/07/2017) for Routine OB.   Judeth Horn, NP

## 2017-03-25 LAB — HIV ANTIBODY (ROUTINE TESTING W REFLEX): HIV SCREEN 4TH GENERATION: NONREACTIVE

## 2017-03-25 LAB — CBC
Hematocrit: 37.3 % (ref 34.0–46.6)
Hemoglobin: 11.8 g/dL (ref 11.1–15.9)
MCH: 29.4 pg (ref 26.6–33.0)
MCHC: 31.6 g/dL (ref 31.5–35.7)
MCV: 93 fL (ref 79–97)
PLATELETS: 295 10*3/uL (ref 150–379)
RBC: 4.01 x10E6/uL (ref 3.77–5.28)
RDW: 13.6 % (ref 12.3–15.4)
WBC: 12.7 10*3/uL — AB (ref 3.4–10.8)

## 2017-03-25 LAB — RPR: RPR: NONREACTIVE

## 2017-03-25 LAB — GLUCOSE TOLERANCE, 2 HOURS W/ 1HR
GLUCOSE, 1 HOUR: 149 mg/dL (ref 65–179)
GLUCOSE, 2 HOUR: 149 mg/dL (ref 65–152)
Glucose, Fasting: 77 mg/dL (ref 65–91)

## 2017-04-07 ENCOUNTER — Encounter: Payer: Self-pay | Admitting: General Practice

## 2017-04-07 ENCOUNTER — Encounter: Payer: Medicaid Other | Admitting: Medical

## 2017-04-16 ENCOUNTER — Encounter: Payer: Medicaid Other | Admitting: Obstetrics and Gynecology

## 2017-04-22 ENCOUNTER — Encounter: Payer: Medicaid Other | Admitting: Certified Nurse Midwife

## 2017-05-05 ENCOUNTER — Ambulatory Visit (INDEPENDENT_AMBULATORY_CARE_PROVIDER_SITE_OTHER): Payer: Medicaid Other | Admitting: Obstetrics and Gynecology

## 2017-05-05 ENCOUNTER — Other Ambulatory Visit (HOSPITAL_COMMUNITY)
Admission: RE | Admit: 2017-05-05 | Discharge: 2017-05-05 | Disposition: A | Discharge status: Home / Self Care | Payer: Medicaid Other | Source: Ambulatory Visit | Attending: Obstetrics and Gynecology | Admitting: Obstetrics and Gynecology

## 2017-05-05 VITALS — BP 120/72 | HR 97 | Wt 238.8 lb

## 2017-05-05 DIAGNOSIS — Z3493 Encounter for supervision of normal pregnancy, unspecified, third trimester: Secondary | ICD-10-CM | POA: Insufficient documentation

## 2017-05-05 DIAGNOSIS — Z113 Encounter for screening for infections with a predominantly sexual mode of transmission: Secondary | ICD-10-CM

## 2017-05-05 NOTE — Progress Notes (Signed)
   PRENATAL VISIT NOTE  Subjective:  Kaitlyn Irwin is a 21 y.o. G3P2002 at 10399w4d being seen today for ongoing prenatal care.  She is currently monitored for the following issues for this low-risk pregnancy and has History of group B Streptococcus (GBS) infection; Family history of Downs syndrome; Supervision of normal pregnancy; Maternal morbid obesity, antepartum (HCC); and Previous cesarean delivery affecting pregnancy, antepartum on her problem list.  Patient reports no complaints.  Contractions: occasional. Vag. Bleeding: None.  Movement: Present. Denies leaking of fluid.   The following portions of the patient's history were reviewed and updated as appropriate: allergies, current medications, past family history, past medical history, past social history, past surgical history and problem list. Problem list updated.  Objective:   Vitals:   05/05/17 0745  BP: 120/72  Pulse: 97  Weight: 238 lb 12.8 oz (108.3 kg)    Fetal Status: Fetal Heart Rate (bpm): 141 Fundal Height: 39 cm Movement: Present     General:  Alert, oriented and cooperative. Patient is in no acute distress.  Skin: Skin is warm and dry. No rash noted.   Cardiovascular: Normal heart rate noted  Respiratory: Normal respiratory effort, no problems with respiration noted  Abdomen: Soft, gravid, appropriate for gestational age. Pain/Pressure: Present     Pelvic:  Cervical exam performed Dilation: Closed Effacement (%): 50 Station: -3  Extremities: Normal range of motion.  Edema: Trace  Mental Status: Normal mood and affect. Normal behavior. Normal judgment and thought content.   Assessment and Plan:  Pregnancy: G3P2002 at 2399w4d  1. Encounter for supervision of normal pregnancy in third trimester, unspecified gravidity  - Strep Gp B NAA - Cervicovaginal ancillary only - Discussed GTT results   Term labor symptoms and general obstetric precautions including but not limited to vaginal bleeding, contractions, leaking  of fluid and fetal movement were reviewed in detail with the patient. Please refer to After Visit Summary for other counseling recommendations.  No Follow-up on file.   Adelina Collard, Harolyn RutherfordJennifer I, NP

## 2017-05-06 ENCOUNTER — Inpatient Hospital Stay (HOSPITAL_COMMUNITY)
Admission: AD | Admit: 2017-05-06 | Discharge: 2017-05-06 | Disposition: A | Discharge status: Home / Self Care | Payer: Medicaid Other | Source: Ambulatory Visit | Attending: Obstetrics and Gynecology | Admitting: Obstetrics and Gynecology

## 2017-05-06 ENCOUNTER — Encounter (HOSPITAL_COMMUNITY): Payer: Self-pay

## 2017-05-06 DIAGNOSIS — O4690 Antepartum hemorrhage, unspecified, unspecified trimester: Secondary | ICD-10-CM

## 2017-05-06 DIAGNOSIS — Z79899 Other long term (current) drug therapy: Secondary | ICD-10-CM | POA: Diagnosis not present

## 2017-05-06 DIAGNOSIS — O368130 Decreased fetal movements, third trimester, not applicable or unspecified: Secondary | ICD-10-CM | POA: Insufficient documentation

## 2017-05-06 DIAGNOSIS — O34219 Maternal care for unspecified type scar from previous cesarean delivery: Secondary | ICD-10-CM | POA: Diagnosis not present

## 2017-05-06 DIAGNOSIS — Z8249 Family history of ischemic heart disease and other diseases of the circulatory system: Secondary | ICD-10-CM | POA: Insufficient documentation

## 2017-05-06 DIAGNOSIS — Z8279 Family history of other congenital malformations, deformations and chromosomal abnormalities: Secondary | ICD-10-CM

## 2017-05-06 DIAGNOSIS — Z3493 Encounter for supervision of normal pregnancy, unspecified, third trimester: Secondary | ICD-10-CM

## 2017-05-06 DIAGNOSIS — Z3A37 37 weeks gestation of pregnancy: Secondary | ICD-10-CM | POA: Diagnosis present

## 2017-05-06 DIAGNOSIS — O368120 Decreased fetal movements, second trimester, not applicable or unspecified: Secondary | ICD-10-CM

## 2017-05-06 DIAGNOSIS — Z833 Family history of diabetes mellitus: Secondary | ICD-10-CM | POA: Diagnosis not present

## 2017-05-06 DIAGNOSIS — Z8619 Personal history of other infectious and parasitic diseases: Secondary | ICD-10-CM

## 2017-05-06 LAB — WET PREP, GENITAL
Sperm: NONE SEEN
Trich, Wet Prep: NONE SEEN
Yeast Wet Prep HPF POC: NONE SEEN

## 2017-05-06 LAB — URINALYSIS, ROUTINE W REFLEX MICROSCOPIC
Bilirubin Urine: NEGATIVE
GLUCOSE, UA: NEGATIVE mg/dL
HGB URINE DIPSTICK: NEGATIVE
Ketones, ur: 5 mg/dL — AB
LEUKOCYTES UA: NEGATIVE
Nitrite: NEGATIVE
PH: 6 (ref 5.0–8.0)
Protein, ur: NEGATIVE mg/dL
Specific Gravity, Urine: 1.014 (ref 1.005–1.030)

## 2017-05-06 LAB — CERVICOVAGINAL ANCILLARY ONLY
Chlamydia: NEGATIVE
NEISSERIA GONORRHEA: NEGATIVE

## 2017-05-06 NOTE — MAU Provider Note (Signed)
MAU HISTORY AND PHYSICAL  Chief Complaint:  Decreased Fetal Movement   Lavena Bullionamara Wimes is a 21 y.o.  G3P2002  at 3873w5d presenting for Decreased Fetal Movement  This morning noticed much less fetal movement than normal. Since arrival baby has begun moving like normal. No leakage of fluid but has noticed occasional streak of pink blood on toilet paper when wipes for past 2 weeks. No abd pain, occasional random contraction. No dysuria. No vaginal pain/pruritus. No recent sex.  History reviewed. No pertinent past medical history.  Past Surgical History:  Procedure Laterality Date  . CESAREAN SECTION     x2    Family History  Problem Relation Age of Onset  . Hypertension Father   . Diabetes Paternal Aunt   . Hypertension Paternal Aunt     Social History  Substance Use Topics  . Smoking status: Never Smoker  . Smokeless tobacco: Never Used  . Alcohol use No    No Known Allergies  Prescriptions Prior to Admission  Medication Sig Dispense Refill Last Dose  . acetaminophen (TYLENOL) 500 MG tablet Take 1,000 mg by mouth every 6 (six) hours as needed for mild pain, moderate pain or headache.   Not Taking    Review of Systems - Negative except for what is mentioned in HPI.  Physical Exam  Blood pressure 131/81, pulse 91, temperature 98.1 F (36.7 C), temperature source Oral, resp. rate 18, height 4\' 11"  (1.499 m), weight 238 lb (108 kg), last menstrual period 08/15/2016, SpO2 99 %. GENERAL: Well-developed, well-nourished female in no acute distress.  LUNGS: Clear to auscultation bilaterally.  HEART: Regular rate and rhythm. ABDOMEN: Soft, nontender, nondistended,   EXTREMITIES: Nontender, no edema, 2+ distal pulses. GU: novmal vagina and cervix, cervix visually closed. No blood. Presentation: cephalic FHT:  145/mod/+a/-d Contractions: none AF: 13.15, MVP 7.57    Labs: Results for orders placed or performed during the hospital encounter of 05/06/17 (from the past 24 hour(s))   Wet prep, genital   Collection Time: 05/06/17  4:50 PM  Result Value Ref Range   Yeast Wet Prep HPF POC NONE SEEN NONE SEEN   Trich, Wet Prep NONE SEEN NONE SEEN   Clue Cells Wet Prep HPF POC PRESENT (A) NONE SEEN   WBC, Wet Prep HPF POC MANY (A) NONE SEEN   Sperm NONE SEEN   Urinalysis, Routine w reflex microscopic   Collection Time: 05/06/17  4:50 PM  Result Value Ref Range   Color, Urine YELLOW YELLOW   APPearance HAZY (A) CLEAR   Specific Gravity, Urine 1.014 1.005 - 1.030   pH 6.0 5.0 - 8.0   Glucose, UA NEGATIVE NEGATIVE mg/dL   Hgb urine dipstick NEGATIVE NEGATIVE   Bilirubin Urine NEGATIVE NEGATIVE   Ketones, ur 5 (A) NEGATIVE mg/dL   Protein, ur NEGATIVE NEGATIVE mg/dL   Nitrite NEGATIVE NEGATIVE   Leukocytes, UA NEGATIVE NEGATIVE    Imaging Studies:  No results found.  Assessment: Lavena Bullionamara Reha is  21 y.o. G3P2002 at 5573w5d presents with decreased fetal movement. Fetus now moving normally. NST reactive, and AFI wnl. Also complained of 2 wks occasional very light spotting. On exam no bleeding. Recent gonorrhea/chlamydia negative; here wet prep positive only for bv (do not think pt's symptoms represent symptomatic infection, so not treating) and urinalysis not suggestive of infection. Will d/c with outpatient scan for growth, am messaging prenatal team to inquire about timing of c/s if spontaneous labor does not occur. Advising daily kick counts; abruption and labor precautions  discussed.    Cherrie Gauze Warnell Rasnic 5/15/20185:32 PM

## 2017-05-06 NOTE — MAU Note (Signed)
Pt c/o decreased fetal movement today. Pt states she noticed around 2pm that the baby hadn't moved any today. Pt states she felt one movement on her way to the hospital. Pt states the last time she felt normal movement was last night. Pt denies bleeding, leaking, and contractions.

## 2017-05-06 NOTE — Discharge Instructions (Signed)
Vaginal Bleeding During Pregnancy, Third Trimester A small amount of bleeding (spotting) from the vagina is common in pregnancy. Sometimes the bleeding is normal and is not a problem, and sometimes it is a sign of something serious. Be sure to tell your doctor about any bleeding from your vagina right away. Follow these instructions at home:  Watch your condition for any changes.  Follow your doctor's instructions about how active you can be.  If you are on bed rest:  You may need to stay in bed and only get up to use the bathroom.  You may be allowed to do some activities.  If you need help, make plans for someone to help you.  Write down:  The number of pads you use each day.  How often you change pads.  How soaked (saturated) your pads are.  Do not use tampons.  Do not douche.  Do not have sex or orgasms until your doctor says it is okay.  Follow your doctor's advice about lifting, driving, and doing physical activities.  If you pass any tissue from your vagina, save the tissue so you can show it to your doctor.  Only take medicines as told by your doctor.  Do not take aspirin because it can make you bleed.  Keep all follow-up visits as told by your doctor. Contact a doctor if:  You bleed from your vagina.  You have cramps.  You have labor pains.  You have a fever that does not go away after you take medicine. Get help right away if:  You have very bad cramps in your back or belly (abdomen).  You have chills.  You have a gush of fluid from your vagina.  You pass large clots or tissue from your vagina.  You bleed more.  You feel light-headed or weak.  You pass out (faint).  You do not feel your baby move around as much as before. This information is not intended to replace advice given to you by your health care provider. Make sure you discuss any questions you have with your health care provider. Document Released: 04/25/2014 Document Revised:  05/16/2016 Document Reviewed: 08/16/2013 Elsevier Interactive Patient Education  2017 [employer] Fetal Movement Counts Patient Name: ________________________________________________ Patient Due Date: ____________________ What is a fetal movement count? A fetal movement count is the number of times that you feel your baby move during a certain amount of time. This may also be called a fetal kick count. A fetal movement count is recommended for every pregnant woman. You may be asked to start counting fetal movements as early as week 28 of your pregnancy. Pay attention to when your baby is most active. You may notice your baby's sleep and wake cycles. You may also notice things that make your baby move more. You should do a fetal movement count:  When your baby is normally most active.  At the same time each day. A good time to count movements is while you are resting, after having something to eat and drink. How do I count fetal movements? 1. Find a quiet, comfortable area. Sit, or lie down on your side. 2. Write down the date, the start time and stop time, and the number of movements that you felt between those two times. Take this information with you to your health care visits. 3. For 2 hours, count kicks, flutters, swishes, rolls, and jabs. You should feel at least 10 movements during 2 hours. 4. You may stop counting after you  have felt 10 movements. 5. If you do not feel 10 movements in 2 hours, have something to eat and drink. Then, keep resting and counting for 1 hour. If you feel at least 4 movements during that hour, you may stop counting. Contact a health care provider if:  You feel fewer than 4 movements in 2 hours.  Your baby is not moving like he or she usually does. Date: ____________ Start time: ____________ Stop time: ____________ Movements: ____________ Date: ____________ Start time: ____________ Stop time: ____________ Movements: ____________ Date: ____________ Start  time: ____________ Stop time: ____________ Movements: ____________ Date: ____________ Start time: ____________ Stop time: ____________ Movements: ____________ Date: ____________ Start time: ____________ Stop time: ____________ Movements: ____________ Date: ____________ Start time: ____________ Stop time: ____________ Movements: ____________ Date: ____________ Start time: ____________ Stop time: ____________ Movements: ____________ Date: ____________ Start time: ____________ Stop time: ____________ Movements: ____________ Date: ____________ Start time: ____________ Stop time: ____________ Movements: ____________ This information is not intended to replace advice given to you by your health care provider. Make sure you discuss any questions you have with your health care provider. Document Released: 01/08/2007 Document Revised: 08/07/2016 Document Reviewed: 01/18/2016 Elsevier Interactive Patient Education  2017 [employer]

## 2017-05-07 LAB — STREP GP B NAA: STREP GROUP B AG: POSITIVE — AB

## 2017-05-12 ENCOUNTER — Ambulatory Visit (INDEPENDENT_AMBULATORY_CARE_PROVIDER_SITE_OTHER): Payer: Medicaid Other | Admitting: Medical

## 2017-05-12 VITALS — BP 127/67 | HR 113 | Wt 239.0 lb

## 2017-05-12 DIAGNOSIS — O34219 Maternal care for unspecified type scar from previous cesarean delivery: Secondary | ICD-10-CM

## 2017-05-12 DIAGNOSIS — Z3493 Encounter for supervision of normal pregnancy, unspecified, third trimester: Secondary | ICD-10-CM

## 2017-05-12 NOTE — Progress Notes (Signed)
   PRENATAL VISIT NOTE  Subjective:  Kaitlyn Irwin is a 21 y.o. G3P2002 at 4336w4d being seen today for ongoing prenatal care.  She is currently monitored for the following issues for this high-risk pregnancy and has History of group B Streptococcus (GBS) infection; Family history of Downs syndrome; Supervision of normal pregnancy; Maternal morbid obesity, antepartum (HCC); and Previous cesarean delivery affecting pregnancy, antepartum on her problem list.  Patient reports no complaints.  Contractions: Not present. Vag. Bleeding: None.  Movement: Present. Denies leaking of fluid.   The following portions of the patient's history were reviewed and updated as appropriate: allergies, current medications, past family history, past medical history, past social history, past surgical history and problem list. Problem list updated.  Objective:   Vitals:   05/12/17 1537  BP: 127/67  Pulse: (!) 113  Weight: 239 lb (108.4 kg)    Fetal Status: Fetal Heart Rate (bpm): 150 Fundal Height: 42 cm Movement: Present     General:  Alert, oriented and cooperative. Patient is in no acute distress.  Skin: Skin is warm and dry. No rash noted.   Cardiovascular: Normal heart rate noted  Respiratory: Normal respiratory effort, no problems with respiration noted  Abdomen: Soft, gravid, appropriate for gestational age. Pain/Pressure: Present     Pelvic:  Cervical exam deferred        Extremities: Normal range of motion.  Edema: None  Mental Status: Normal mood and affect. Normal behavior. Normal judgment and thought content.   Assessment and Plan:  Pregnancy: G3P2002 at 3636w4d  1. Encounter for supervision of normal pregnancy in third trimester, unspecified gravidity - +GBS  2. Previous cesarean delivery affecting pregnancy, antepartum - Patient has elected to have C/S at 39 weeks due to possible LGA and childcare issues  - Message sent to schedule C/S. Pre-op will notify patient of appointment information    Term labor symptoms and general obstetric precautions including but not limited to vaginal bleeding, contractions, leaking of fluid and fetal movement were reviewed in detail with the patient. Please refer to After Visit Summary for other counseling recommendations.  Return for 6 weeks PP.   Vonzella NippleJulie Walker Sitar, PA-C

## 2017-05-12 NOTE — Patient Instructions (Signed)
Fetal Movement Counts Patient Name: ________________________________________________ Patient Due Date: ____________________ What is a fetal movement count? A fetal movement count is the number of times that you feel your baby move during a certain amount of time. This may also be called a fetal kick count. A fetal movement count is recommended for every pregnant woman. You may be asked to start counting fetal movements as early as week 28 of your pregnancy. Pay attention to when your baby is most active. You may notice your baby's sleep and wake cycles. You may also notice things that make your baby move more. You should do a fetal movement count:  When your baby is normally most active.  At the same time each day. A good time to count movements is while you are resting, after having something to eat and drink. How do I count fetal movements? 1. Find a quiet, comfortable area. Sit, or lie down on your side. 2. Write down the date, the start time and stop time, and the number of movements that you felt between those two times. Take this information with you to your health care visits. 3. For 2 hours, count kicks, flutters, swishes, rolls, and jabs. You should feel at least 10 movements during 2 hours. 4. You may stop counting after you have felt 10 movements. 5. If you do not feel 10 movements in 2 hours, have something to eat and drink. Then, keep resting and counting for 1 hour. If you feel at least 4 movements during that hour, you may stop counting. Contact a health care provider if:  You feel fewer than 4 movements in 2 hours.  Your baby is not moving like he or she usually does. Date: ____________ Start time: ____________ Stop time: ____________ Movements: ____________ Date: ____________ Start time: ____________ Stop time: ____________ Movements: ____________ Date: ____________ Start time: ____________ Stop time: ____________ Movements: ____________ Date: ____________ Start time:  ____________ Stop time: ____________ Movements: ____________ Date: ____________ Start time: ____________ Stop time: ____________ Movements: ____________ Date: ____________ Start time: ____________ Stop time: ____________ Movements: ____________ Date: ____________ Start time: ____________ Stop time: ____________ Movements: ____________ Date: ____________ Start time: ____________ Stop time: ____________ Movements: ____________ Date: ____________ Start time: ____________ Stop time: ____________ Movements: ____________ This information is not intended to replace advice given to you by your health care provider. Make sure you discuss any questions you have with your health care provider. Document Released: 01/08/2007 Document Revised: 08/07/2016 Document Reviewed: 01/18/2016 Elsevier Interactive Patient Education  2017 Else[employer] Braxton Hicks Contractions Contractions of the uterus can occur throughout pregnancy, but they are not always a sign that you are in labor. You may have practice contractions called Braxton Hicks contractions. These false labor contractions are sometimes confused with true labor. What are Braxton Hicks contractions? Braxton Hicks contractions are tightening movements that occur in the muscles of the uterus before labor. Unlike true labor contractions, these contractions do not result in opening (dilation) and thinning of the cervix. Toward the end of pregnancy (32-34 weeks), Braxton Hicks contractions can happen more often and may become stronger. These contractions are sometimes difficult to tell apart from true labor because they can be very uncomfortable. You should not feel embarrassed if you go to the hospital with false labor. Sometimes, the only way to tell if you are in true labor is for your health care provider to look for changes in the cervix. The health care provider will do a physical exam and may monitor your contractions. If you   are not in true labor, the exam  should show that your cervix is not dilating and your water has not broken. If there are no prenatal problems or other health problems associated with your pregnancy, it is completely safe for you to be sent home with false labor. You may continue to have Braxton Hicks contractions until you go into true labor. How can I tell the difference between true labor and false labor?  Differences  False labor  Contractions last 30-70 seconds.: Contractions are usually shorter and not as strong as true labor contractions.  Contractions become very regular.: Contractions are usually irregular.  Discomfort is usually felt in the top of the uterus, and it spreads to the lower abdomen and low back.: Contractions are often felt in the front of the lower abdomen and in the groin.  Contractions do not go away with walking.: Contractions may go away when you walk around or change positions while lying down.  Contractions usually become more intense and increase in frequency.: Contractions get weaker and are shorter-lasting as time goes on.  The cervix dilates and gets thinner.: The cervix usually does not dilate or become thin. Follow these instructions at home:  Take over-the-counter and prescription medicines only as told by your health care provider.  Keep up with your usual exercises and follow other instructions from your health care provider.  Eat and drink lightly if you think you are going into labor.  If Braxton Hicks contractions are making you uncomfortable:  Change your position from lying down or resting to walking, or change from walking to resting.  Sit and rest in a tub of warm water.  Drink enough fluid to keep your urine clear or pale yellow. Dehydration may cause these contractions.  Do slow and deep breathing several times an hour.  Keep all follow-up prenatal visits as told by your health care provider. This is important. Contact a health care provider if:  You have a  fever.  You have continuous pain in your abdomen. Get help right away if:  Your contractions become stronger, more regular, and closer together.  You have fluid leaking or gushing from your vagina.  You pass blood-tinged mucus (bloody show).  You have bleeding from your vagina.  You have low back pain that you never had before.  You feel your baby's head pushing down and causing pelvic pressure.  Your baby is not moving inside you as much as it used to. Summary  Contractions that occur before labor are called Braxton Hicks contractions, false labor, or practice contractions.  Braxton Hicks contractions are usually shorter, weaker, farther apart, and less regular than true labor contractions. True labor contractions usually become progressively stronger and regular and they become more frequent.  Manage discomfort from Braxton Hicks contractions by changing position, resting in a warm bath, drinking plenty of water, or practicing deep breathing. This information is not intended to replace advice given to you by your health care provider. Make sure you discuss any questions you have with your health care provider. Document Released: 12/09/2005 Document Revised: 10/28/2016 Document Reviewed: 10/28/2016 Elsevier Interactive Patient Education  2017 Elsevier Inc.  

## 2017-05-13 ENCOUNTER — Encounter (HOSPITAL_COMMUNITY): Payer: Self-pay

## 2017-05-14 ENCOUNTER — Ambulatory Visit (HOSPITAL_COMMUNITY): Payer: Medicaid Other

## 2017-05-16 ENCOUNTER — Encounter (HOSPITAL_COMMUNITY)
Admission: RE | Admit: 2017-05-16 | Discharge: 2017-05-16 | Disposition: A | Discharge status: Home / Self Care | Payer: Medicaid Other | Source: Ambulatory Visit | Attending: Obstetrics & Gynecology | Admitting: Obstetrics & Gynecology

## 2017-05-16 DIAGNOSIS — Z01812 Encounter for preprocedural laboratory examination: Secondary | ICD-10-CM | POA: Diagnosis not present

## 2017-05-16 LAB — CBC
HCT: 38.5 % (ref 36.0–46.0)
HEMOGLOBIN: 12.5 g/dL (ref 12.0–15.0)
MCH: 29.9 pg (ref 26.0–34.0)
MCHC: 32.5 g/dL (ref 30.0–36.0)
MCV: 92.1 fL (ref 78.0–100.0)
Platelets: 272 10*3/uL (ref 150–400)
RBC: 4.18 MIL/uL (ref 3.87–5.11)
RDW: 14.6 % (ref 11.5–15.5)
WBC: 10.3 10*3/uL (ref 4.0–10.5)

## 2017-05-16 LAB — TYPE AND SCREEN
ABO/RH(D): A POS
Antibody Screen: NEGATIVE

## 2017-05-16 LAB — ABO/RH: ABO/RH(D): A POS

## 2017-05-16 NOTE — Patient Instructions (Signed)
[address] Lavena Bullionamara Kite  05/16/2017   Your procedure is scheduled on:  05/18/2017  [facility] at 0900 AM.  Pick up the phone at the desk and dial [phone].   Call this number if you have problems the morning of surgery: [phone]   Remember:   Do not eat food:After Midnight.  Do not drink clear liquids: After Midnight.  Take these medicines the morning of surgery with A SIP OF WATER: none   Do not wear jewelry, make-up or nail polish.  Do not wear lotions, powders, or perfumes. Do not wear deodorant.  Do not shave 48 hours prior to surgery.  Do not bring valuables to the hospital.  [facility] is not   responsible for any belongings or valuables brought to the hospital.  Contacts, dentures or bridgework may not be worn into surgery.  Leave suitcase in the car. After surgery it may be brought to your room.  For patients admitted to the hospital, checkout time is 11:00 AM the day of              discharge.   Patients discharged the day of surgery will not be allowed to drive             home.  Name and phone number of your driver: na  Special Instructions:   N/A   Please read over the following fact sheets that you were given:   Surgical Site Infection Prevention

## 2017-05-17 ENCOUNTER — Other Ambulatory Visit: Payer: Self-pay | Admitting: Obstetrics and Gynecology

## 2017-05-17 LAB — RPR: RPR Ser Ql: NONREACTIVE

## 2017-05-17 MED ORDER — SOD CITRATE-CITRIC ACID 500-334 MG/5ML PO SOLN: 30.0000 mL | ORAL | Status: DC

## 2017-05-17 MED ORDER — DEXTROSE 5 % IV SOLN: 2.0000 g | INTRAVENOUS | Status: DC

## 2017-05-17 MED ORDER — LACTATED RINGERS IV SOLN: INTRAVENOUS | Status: DC

## 2017-05-17 NOTE — Anesthesia Preprocedure Evaluation (Addendum)
Anesthesia Evaluation  Patient identified by MRN, date of birth, ID band Patient awake    Reviewed: Allergy & Precautions, NPO status , Patient's Chart, lab work & pertinent test results  Airway Mallampati: III  TM Distance: >3 FB Neck ROM: Full    Dental  (+) Teeth Intact, Dental Advisory Given   Pulmonary neg pulmonary ROS,    breath sounds clear to auscultation       Cardiovascular negative cardio ROS   Rhythm:Regular Rate:Normal     Neuro/Psych negative neurological ROS  negative psych ROS   GI/Hepatic negative GI ROS, Neg liver ROS,   Endo/Other  negative endocrine ROS  Renal/GU negative Renal ROS  negative genitourinary   Musculoskeletal negative musculoskeletal ROS (+)   Abdominal   Peds negative pediatric ROS (+)  Hematology negative hematology ROS (+)   Anesthesia Other Findings Day of surgery medications reviewed with the patient.  Reproductive/Obstetrics (+) Pregnancy                            Lab Results  Component Value Date   WBC 10.3 05/16/2017   HGB 12.5 05/16/2017   HCT 38.5 05/16/2017   MCV 92.1 05/16/2017   PLT 272 05/16/2017     Anesthesia Physical Anesthesia Plan  ASA: III  Anesthesia Plan: Spinal   Post-op Pain Management:    Induction:   Airway Management Planned:   Additional Equipment:   Intra-op Plan:   Post-operative Plan:   Informed Consent: I have reviewed the patients History and Physical, chart, labs and discussed the procedure including the risks, benefits and alternatives for the proposed anesthesia with the patient or authorized representative who has indicated his/her understanding and acceptance.     Plan Discussed with:   Anesthesia Plan Comments:         Anesthesia Quick Evaluation

## 2017-05-17 NOTE — OR Nursing (Signed)
Called Dr Vergie LivingPickens for preop orders

## 2017-05-17 NOTE — Progress Notes (Signed)
RN called  Student CNM ([phone]) Antony Odeaaroline Neil about pt pre op orders for pt schedule c- section 05/18/17

## 2017-05-18 ENCOUNTER — Encounter (HOSPITAL_COMMUNITY): Payer: Self-pay | Admitting: *Deleted

## 2017-05-18 ENCOUNTER — Inpatient Hospital Stay (HOSPITAL_COMMUNITY): Payer: Medicaid Other

## 2017-05-18 ENCOUNTER — Inpatient Hospital Stay (HOSPITAL_COMMUNITY)
Admission: RE | Admit: 2017-05-18 | Discharge: 2017-05-20 | DRG: 765 | Disposition: A | Discharge status: Home / Self Care | Payer: Medicaid Other | Source: Ambulatory Visit | Attending: Obstetrics & Gynecology | Admitting: Obstetrics & Gynecology

## 2017-05-18 ENCOUNTER — Encounter (HOSPITAL_COMMUNITY)
Admission: RE | Disposition: A | Discharge status: Home / Self Care | Payer: Self-pay | Source: Ambulatory Visit | Attending: Obstetrics & Gynecology

## 2017-05-18 DIAGNOSIS — Z3A39 39 weeks gestation of pregnancy: Secondary | ICD-10-CM

## 2017-05-18 DIAGNOSIS — Z8279 Family history of other congenital malformations, deformations and chromosomal abnormalities: Secondary | ICD-10-CM | POA: Diagnosis not present

## 2017-05-18 DIAGNOSIS — O34211 Maternal care for low transverse scar from previous cesarean delivery: Principal | ICD-10-CM | POA: Diagnosis present

## 2017-05-18 DIAGNOSIS — O99824 Streptococcus B carrier state complicating childbirth: Secondary | ICD-10-CM | POA: Diagnosis present

## 2017-05-18 DIAGNOSIS — O99214 Obesity complicating childbirth: Secondary | ICD-10-CM | POA: Diagnosis present

## 2017-05-18 DIAGNOSIS — Z302 Encounter for sterilization: Secondary | ICD-10-CM | POA: Diagnosis not present

## 2017-05-18 DIAGNOSIS — Z3490 Encounter for supervision of normal pregnancy, unspecified, unspecified trimester: Secondary | ICD-10-CM

## 2017-05-18 DIAGNOSIS — Z9889 Other specified postprocedural states: Secondary | ICD-10-CM

## 2017-05-18 DIAGNOSIS — Z8619 Personal history of other infectious and parasitic diseases: Secondary | ICD-10-CM | POA: Diagnosis present

## 2017-05-18 DIAGNOSIS — Z3493 Encounter for supervision of normal pregnancy, unspecified, third trimester: Secondary | ICD-10-CM

## 2017-05-18 DIAGNOSIS — Z6841 Body Mass Index (BMI) 40.0 and over, adult: Secondary | ICD-10-CM

## 2017-05-18 DIAGNOSIS — O34219 Maternal care for unspecified type scar from previous cesarean delivery: Secondary | ICD-10-CM

## 2017-05-18 DIAGNOSIS — O99210 Obesity complicating pregnancy, unspecified trimester: Secondary | ICD-10-CM

## 2017-05-18 HISTORY — PX: TUBAL LIGATION: SHX77

## 2017-05-18 SURGERY — Surgical Case
Anesthesia: Spinal | Wound class: Clean Contaminated

## 2017-05-18 MED ORDER — BUPIVACAINE HCL (PF) 0.5 % IJ SOLN
INTRAMUSCULAR | Status: DC | PRN
  Administered 2017-05-18: 30 mL

## 2017-05-18 MED ORDER — IBUPROFEN 600 MG PO TABS: 600.0000 mg | ORAL_TABLET | Freq: Four times a day (QID) | ORAL | Status: DC | PRN

## 2017-05-18 MED ORDER — SODIUM CHLORIDE 0.9% FLUSH
3.0000 mL | INTRAVENOUS | Status: DC | PRN
  Administered 2017-05-19: 3 mL via INTRAVENOUS
  Filled 2017-05-18: qty 3

## 2017-05-18 MED ORDER — FENTANYL CITRATE (PF) 100 MCG/2ML IJ SOLN
25.0000 ug | INTRAMUSCULAR | Status: DC | PRN
Start: 2017-05-18 — End: 2017-05-18

## 2017-05-18 MED ORDER — FENTANYL CITRATE (PF) 100 MCG/2ML IJ SOLN
INTRAMUSCULAR | Status: AC
  Filled 2017-05-18: qty 2

## 2017-05-18 MED ORDER — NALBUPHINE HCL 10 MG/ML IJ SOLN: 5.0000 mg | Freq: Once | INTRAMUSCULAR | Status: DC | PRN

## 2017-05-18 MED ORDER — BUPIVACAINE IN DEXTROSE 0.75-8.25 % IT SOLN
INTRATHECAL | Status: AC
  Filled 2017-05-18: qty 2

## 2017-05-18 MED ORDER — OXYTOCIN 10 UNIT/ML IJ SOLN
INTRAVENOUS | Status: DC | PRN
  Administered 2017-05-18: 40 [IU] via INTRAVENOUS

## 2017-05-18 MED ORDER — MEPERIDINE HCL 25 MG/ML IJ SOLN: 6.2500 mg | INTRAMUSCULAR | Status: DC | PRN

## 2017-05-18 MED ORDER — NALBUPHINE HCL 10 MG/ML IJ SOLN: 5.0000 mg | INTRAMUSCULAR | Status: DC | PRN

## 2017-05-18 MED ORDER — DIBUCAINE 1 % RE OINT: 1.0000 "application " | TOPICAL_OINTMENT | RECTAL | Status: DC | PRN

## 2017-05-18 MED ORDER — MORPHINE SULFATE (PF) 0.5 MG/ML IJ SOLN
INTRAMUSCULAR | Status: AC
  Filled 2017-05-18: qty 10

## 2017-05-18 MED ORDER — MEASLES, MUMPS & RUBELLA VAC ~~LOC~~ INJ
0.5000 mL | INJECTION | Freq: Once | SUBCUTANEOUS | Status: DC
  Filled 2017-05-18: qty 0.5

## 2017-05-18 MED ORDER — DEXTROSE 5 % IV SOLN
1.0000 ug/kg/h | INTRAVENOUS | Status: DC | PRN
  Filled 2017-05-18: qty 2

## 2017-05-18 MED ORDER — BUPIVACAINE HCL (PF) 0.5 % IJ SOLN
INTRAMUSCULAR | Status: AC
  Filled 2017-05-18: qty 30

## 2017-05-18 MED ORDER — LACTATED RINGERS IV SOLN
INTRAVENOUS | Status: DC
  Administered 2017-05-18 (×2): via INTRAVENOUS

## 2017-05-18 MED ORDER — DIPHENHYDRAMINE HCL 50 MG/ML IJ SOLN
INTRAMUSCULAR | Status: AC
  Filled 2017-05-18: qty 1

## 2017-05-18 MED ORDER — OXYTOCIN 40 UNITS IN LACTATED RINGERS INFUSION - SIMPLE MED: 2.5000 [IU]/h | INTRAVENOUS | Status: AC

## 2017-05-18 MED ORDER — DIPHENHYDRAMINE HCL 50 MG/ML IJ SOLN
12.5000 mg | INTRAMUSCULAR | Status: DC | PRN
  Administered 2017-05-18: 12.5 mg via INTRAVENOUS

## 2017-05-18 MED ORDER — IBUPROFEN 600 MG PO TABS
600.0000 mg | ORAL_TABLET | Freq: Four times a day (QID) | ORAL | Status: DC
  Administered 2017-05-18 – 2017-05-20 (×8): 600 mg via ORAL
  Filled 2017-05-18 (×8): qty 1

## 2017-05-18 MED ORDER — SENNOSIDES-DOCUSATE SODIUM 8.6-50 MG PO TABS
2.0000 | ORAL_TABLET | ORAL | Status: DC
  Administered 2017-05-18 – 2017-05-19 (×2): 2 via ORAL
  Filled 2017-05-18 (×2): qty 2

## 2017-05-18 MED ORDER — PROMETHAZINE HCL 25 MG/ML IJ SOLN: 6.2500 mg | INTRAMUSCULAR | Status: DC | PRN

## 2017-05-18 MED ORDER — COCONUT OIL OIL: 1.0000 "application " | TOPICAL_OIL | Status: DC | PRN

## 2017-05-18 MED ORDER — ONDANSETRON HCL 4 MG/2ML IJ SOLN
INTRAMUSCULAR | Status: DC | PRN
  Administered 2017-05-18: 4 mg via INTRAVENOUS

## 2017-05-18 MED ORDER — ONDANSETRON HCL 4 MG/2ML IJ SOLN: 4.0000 mg | Freq: Three times a day (TID) | INTRAMUSCULAR | Status: DC | PRN

## 2017-05-18 MED ORDER — CEFAZOLIN SODIUM-DEXTROSE 2-4 GM/100ML-% IV SOLN: 2.0000 g | INTRAVENOUS | Status: DC

## 2017-05-18 MED ORDER — SIMETHICONE 80 MG PO CHEW
80.0000 mg | CHEWABLE_TABLET | Freq: Three times a day (TID) | ORAL | Status: DC
  Administered 2017-05-18 – 2017-05-20 (×5): 80 mg via ORAL
  Filled 2017-05-18 (×5): qty 1

## 2017-05-18 MED ORDER — OXYTOCIN 10 UNIT/ML IJ SOLN
INTRAMUSCULAR | Status: AC
  Filled 2017-05-18: qty 4

## 2017-05-18 MED ORDER — KETOROLAC TROMETHAMINE 30 MG/ML IJ SOLN: 30.0000 mg | Freq: Four times a day (QID) | INTRAMUSCULAR | Status: AC | PRN

## 2017-05-18 MED ORDER — CEFAZOLIN SODIUM-DEXTROSE 2-4 GM/100ML-% IV SOLN
2.0000 g | INTRAVENOUS | Status: AC
  Administered 2017-05-18: 2 g via INTRAVENOUS
  Filled 2017-05-18: qty 100

## 2017-05-18 MED ORDER — NALBUPHINE HCL 10 MG/ML IJ SOLN
5.0000 mg | INTRAMUSCULAR | Status: DC | PRN
  Administered 2017-05-18: 5 mg via INTRAVENOUS
  Filled 2017-05-18: qty 1

## 2017-05-18 MED ORDER — LACTATED RINGERS IV SOLN
INTRAVENOUS | Status: DC
  Administered 2017-05-18 (×2): via INTRAVENOUS

## 2017-05-18 MED ORDER — ONDANSETRON HCL 4 MG/2ML IJ SOLN
INTRAMUSCULAR | Status: AC
  Filled 2017-05-18: qty 2

## 2017-05-18 MED ORDER — SCOPOLAMINE 1 MG/3DAYS TD PT72
1.0000 | MEDICATED_PATCH | Freq: Once | TRANSDERMAL | Status: DC
  Filled 2017-05-18: qty 1

## 2017-05-18 MED ORDER — PRENATAL MULTIVITAMIN CH
1.0000 | ORAL_TABLET | Freq: Every day | ORAL | Status: DC
  Administered 2017-05-19 – 2017-05-20 (×2): 1 via ORAL
  Filled 2017-05-18 (×2): qty 1

## 2017-05-18 MED ORDER — SOD CITRATE-CITRIC ACID 500-334 MG/5ML PO SOLN
30.0000 mL | ORAL | Status: AC
  Administered 2017-05-18: 30 mL via ORAL
  Filled 2017-05-18: qty 15

## 2017-05-18 MED ORDER — DIPHENHYDRAMINE HCL 25 MG PO CAPS
25.0000 mg | ORAL_CAPSULE | ORAL | Status: DC | PRN
  Filled 2017-05-18: qty 1

## 2017-05-18 MED ORDER — WITCH HAZEL-GLYCERIN EX PADS: 1.0000 "application " | MEDICATED_PAD | CUTANEOUS | Status: DC | PRN

## 2017-05-18 MED ORDER — ACETAMINOPHEN 325 MG PO TABS: 650.0000 mg | ORAL_TABLET | ORAL | Status: DC | PRN

## 2017-05-18 MED ORDER — TETANUS-DIPHTH-ACELL PERTUSSIS 5-2.5-18.5 LF-MCG/0.5 IM SUSP: 0.5000 mL | Freq: Once | INTRAMUSCULAR | Status: DC

## 2017-05-18 MED ORDER — SIMETHICONE 80 MG PO CHEW: 80.0000 mg | CHEWABLE_TABLET | ORAL | Status: DC | PRN

## 2017-05-18 MED ORDER — OXYCODONE-ACETAMINOPHEN 5-325 MG PO TABS
2.0000 | ORAL_TABLET | ORAL | Status: DC | PRN
  Administered 2017-05-19 – 2017-05-20 (×3): 2 via ORAL
  Filled 2017-05-18 (×3): qty 2

## 2017-05-18 MED ORDER — PHENYLEPHRINE 8 MG IN D5W 100 ML (0.08MG/ML) PREMIX OPTIME
INJECTION | INTRAVENOUS | Status: DC | PRN
  Administered 2017-05-18: 80 ug/min via INTRAVENOUS

## 2017-05-18 MED ORDER — MENTHOL 3 MG MT LOZG: 1.0000 | LOZENGE | OROMUCOSAL | Status: DC | PRN

## 2017-05-18 MED ORDER — MORPHINE SULFATE (PF) 0.5 MG/ML IJ SOLN
INTRAMUSCULAR | Status: DC | PRN
  Administered 2017-05-18: .2 mg via INTRATHECAL

## 2017-05-18 MED ORDER — KETOROLAC TROMETHAMINE 30 MG/ML IJ SOLN
INTRAMUSCULAR | Status: AC
  Administered 2017-05-18: 30 mg
  Filled 2017-05-18: qty 1

## 2017-05-18 MED ORDER — BUPIVACAINE IN DEXTROSE 0.75-8.25 % IT SOLN
INTRATHECAL | Status: DC | PRN
  Administered 2017-05-18: 1.4 mL via INTRATHECAL

## 2017-05-18 MED ORDER — DIPHENHYDRAMINE HCL 25 MG PO CAPS: 25.0000 mg | ORAL_CAPSULE | Freq: Four times a day (QID) | ORAL | Status: DC | PRN

## 2017-05-18 MED ORDER — LACTATED RINGERS IV SOLN
INTRAVENOUS | Status: DC | PRN
  Administered 2017-05-18: 10:00:00 via INTRAVENOUS

## 2017-05-18 MED ORDER — NALOXONE HCL 0.4 MG/ML IJ SOLN: 0.4000 mg | INTRAMUSCULAR | Status: DC | PRN

## 2017-05-18 MED ORDER — SIMETHICONE 80 MG PO CHEW
80.0000 mg | CHEWABLE_TABLET | ORAL | Status: DC
  Administered 2017-05-18 – 2017-05-19 (×2): 80 mg via ORAL
  Filled 2017-05-18 (×2): qty 1

## 2017-05-18 MED ORDER — ZOLPIDEM TARTRATE 5 MG PO TABS: 5.0000 mg | ORAL_TABLET | Freq: Every evening | ORAL | Status: DC | PRN

## 2017-05-18 MED ORDER — OXYCODONE-ACETAMINOPHEN 5-325 MG PO TABS: 1.0000 | ORAL_TABLET | ORAL | Status: DC | PRN

## 2017-05-18 MED ORDER — LACTATED RINGERS IV SOLN: INTRAVENOUS | Status: DC

## 2017-05-18 MED ORDER — FENTANYL CITRATE (PF) 100 MCG/2ML IJ SOLN
INTRAMUSCULAR | Status: DC | PRN
  Administered 2017-05-18: 10 ug via INTRATHECAL

## 2017-05-18 MED ORDER — PHENYLEPHRINE 8 MG IN D5W 100 ML (0.08MG/ML) PREMIX OPTIME
INJECTION | INTRAVENOUS | Status: AC
  Filled 2017-05-18: qty 100

## 2017-05-18 SURGICAL SUPPLY — 39 items
BENZOIN TINCTURE PRP APPL 2/3 (GAUZE/BANDAGES/DRESSINGS) ×4 IMPLANT
BLADE TIP J-PLASMA PRECISE LAP (MISCELLANEOUS) ×4 IMPLANT
CHLORAPREP W/TINT 26ML (MISCELLANEOUS) ×4 IMPLANT
CLAMP CORD UMBIL (MISCELLANEOUS) IMPLANT
CLIP FILSHIE TUBAL LIGA STRL (Clip) ×8 IMPLANT
CLOSURE WOUND 1/2 X4 (GAUZE/BANDAGES/DRESSINGS) ×1
CLOTH BEACON ORANGE TIMEOUT ST (SAFETY) ×4 IMPLANT
DRSG OPSITE POSTOP 4X10 (GAUZE/BANDAGES/DRESSINGS) ×4 IMPLANT
ELECT REM PT RETURN 9FT ADLT (ELECTROSURGICAL) ×4
ELECTRODE REM PT RTRN 9FT ADLT (ELECTROSURGICAL) ×2 IMPLANT
EXTRACTOR VACUUM KIWI (MISCELLANEOUS) IMPLANT
GLOVE BIO SURGEON STRL SZ7 (GLOVE) ×4 IMPLANT
GLOVE BIOGEL PI IND STRL 7.0 (GLOVE) ×4 IMPLANT
GLOVE BIOGEL PI INDICATOR 7.0 (GLOVE) ×4
GOWN STRL REUS W/TWL LRG LVL3 (GOWN DISPOSABLE) ×8 IMPLANT
GOWN STRL REUS W/TWL XL LVL3 (GOWN DISPOSABLE) ×4 IMPLANT
HOVERMATT SINGLE USE (MISCELLANEOUS) ×4 IMPLANT
KIT ABG SYR 3ML LUER SLIP (SYRINGE) IMPLANT
NEEDLE HYPO 22GX1.5 SAFETY (NEEDLE) ×4 IMPLANT
NEEDLE HYPO 25X5/8 SAFETYGLIDE (NEEDLE) IMPLANT
NS IRRIG 1000ML POUR BTL (IV SOLUTION) ×4 IMPLANT
PACK C SECTION WH (CUSTOM PROCEDURE TRAY) ×4 IMPLANT
PAD OB MATERNITY 4.3X12.25 (PERSONAL CARE ITEMS) ×4 IMPLANT
PENCIL SMOKE EVAC W/HOLSTER (ELECTROSURGICAL) ×4 IMPLANT
RETRACTOR TRAXI PANNICULUS (MISCELLANEOUS) ×2 IMPLANT
RTRCTR C-SECT PINK 25CM LRG (MISCELLANEOUS) IMPLANT
SPONGE SURGIFOAM ABS GEL 12-7 (HEMOSTASIS) IMPLANT
STRIP CLOSURE SKIN 1/2X4 (GAUZE/BANDAGES/DRESSINGS) ×3 IMPLANT
SUT PDS AB 0 CTX 60 (SUTURE) IMPLANT
SUT PLAIN 0 NONE (SUTURE) IMPLANT
SUT SILK 0 TIES 10X30 (SUTURE) IMPLANT
SUT VIC AB 0 CT1 36 (SUTURE) ×12 IMPLANT
SUT VIC AB 3-0 CT1 27 (SUTURE) ×2
SUT VIC AB 3-0 CT1 TAPERPNT 27 (SUTURE) ×2 IMPLANT
SUT VIC AB 4-0 KS 27 (SUTURE) IMPLANT
SYR CONTROL 10ML LL (SYRINGE) ×4 IMPLANT
TOWEL OR 17X24 6PK STRL BLUE (TOWEL DISPOSABLE) ×4 IMPLANT
TRAXI PANNICULUS RETRACTOR (MISCELLANEOUS) ×2
TRAY FOLEY BAG SILVER LF 14FR (SET/KITS/TRAYS/PACK) ×4 IMPLANT

## 2017-05-18 NOTE — Op Note (Signed)
05/18/2017  10:41 AM  PATIENT:  Kaitlyn Irwin  21 y.o. female  PRE-OPERATIVE DIAGNOSIS:  repeat c-section undesired fertility  POST-OPERATIVE DIAGNOSIS:  repeat c-section undesired fertility  PROCEDURE:  Procedure(s): REPEAT CESAREAN SECTION (N/A) with bilateral tubal ligation  SURGEON:  Surgeon(s) and Role:    * Willodean RosenthalHarraway-Smith, Quinnten Calvin, MD - Primary  ANESTHESIA:   spinal  EBL:  Total I/O In: 2500 [I.V.:2500] Out: 700 [Urine:100; Blood:600]  BLOOD ADMINISTERED:none  DRAINS: none   LOCAL MEDICATIONS USED:  MARCAINE     SPECIMEN:  Source of Specimen:  placenta  DISPOSITION OF SPECIMEN:  Labor and delivery  COUNTS:  YES  TOURNIQUET:  * No tourniquets in log *  DICTATION: .Note written in EPIC  PLAN OF CARE: Admit to inpatient   PATIENT DISPOSITION:  PACU - hemodynamically stable.   Delay start of Pharmacological VTE agent (>24hrs) due to surgical blood loss or risk of bleeding: yes  Complications: none immediate  INDICATIONS: Kaitlyn Irwin is a 21 y.o. G3P2002 at 3319w3d here for cesarean section secondary to the indications listed under preoperative diagnosis; please see preoperative note for further details.  The risks of cesarean section were discussed with the patient including but were not limited to: bleeding which may require transfusion or reoperation; infection which may require antibiotics; injury to bowel, bladder, ureters or other surrounding organs; injury to the fetus; need for additional procedures including hysterectomy in the event of a life-threatening hemorrhage; placental abnormalities wth subsequent pregnancies, incisional problems, thromboembolic phenomenon and other postoperative/anesthesia complications.   The patient concurred with the proposed plan, giving informed written consent for the procedure.    FINDINGS:  Viable female infant in cephalic presentation.  Apgars 8 and 9.  Clear amniotic fluid.  Intact placenta, three vessel cord.  Normal uterus,  fallopian tubes and ovaries bilaterally.  PROCEDURE IN DETAIL:  The patient preoperatively received intravenous antibiotics and had sequential compression devices applied to her lower extremities.  She was then taken to the operating room where spinal anesthesia was administered and was found to be adequate. She was then placed in a dorsal supine position with a leftward tilt, and prepped and draped in a sterile manner.  A foley catheter was placed into her bladder and attached to constant gravity.  After an adequate timeout was performed, a Pfannenstiel skin incision was made with scalpel and carried through to the underlying layer of fascia. The fascia was incised in the midline, and this incision was extended bilaterally using the Mayo scissors.  Kocher clamps were applied to the superior aspect of the fascial incision and the underlying rectus muscles were dissected off bluntly. A similar process was carried out on the inferior aspect of the fascial incision. The rectus muscles were separated in the midline bluntly and the peritoneum was entered bluntly. There were adhesions of omentum to the uterus and the lower uterine segment.  These were release with the Bovie. No bleeding was noted.  Attention was turned to the lower uterine segment where a low transverse hysterotomy incision was made with a scalpel and extended bilaterally bluntly.  The infant was successfully delivered, the cord was clamped and cut and the infant was handed over to awaiting neonatology team. The placenta was delivered manually. Uterine massage was then administered.  The placenta was intact with a three-vessel cord. The uterus was then cleared of clot and debris.  The hysterotomy was closed with 0 Vicryl in a running locked fashion, and an imbricating layer was also placed with the  same suture. The fallopian tubes were identified. I asked the mother one last time if she still wanted her tubal ligation. When she answered affirmatively, I  placed Filshie clips on both fallopian tubes leading ot permanent sterilization.  The uterus was returned to the pelvis. The pelvis was cleared of all clot and debris. Hemostasis was confirmed on all surfaces.  The peritoneum and the muscles were reapproximated using 0 Vicryl with 1 interrupted suture. The fascia was then closed using 0 Looped PDS.  The subcutaneous layer was irrigated, then reapproximated with 3-0 vicryl in a running locked fashion, and the skin was closed with a 4-0 Vicryl subcuticular stitch.  30 cc of 0.5% marcaine was injected into the incision and benzoin and steristrips were applied.  The patient tolerated the procedure well. Sponge, lap, instrument and needle counts were correct x 2.  She was taken to the recovery room in stable condition.   Cherl Gorney L. Harraway-Smith, M.D., [facility]

## 2017-05-18 NOTE — H&P (Addendum)
Obstetric Preoperative History and Physical  Kaitlyn Irwin is a 21 y.o. [id number] with IUP at [redacted]w[redacted]d presenting for presenting for scheduled repeat cesarean section with permanent sterilization.  No acute concerns.   Prenatal Course Source of Care: [facility]  with onset of care in first trimester Pregnancy complications or risks: Patient Active Problem List   Diagnosis Date Noted  . Supervision of normal pregnancy 03/03/2017  . Maternal morbid obesity, antepartum (HCC) 03/03/2017  . Previous cesarean delivery affecting pregnancy, antepartum 03/03/2017  . History of group B Streptococcus (GBS) infection 02/28/2017  . Family history of Downs syndrome 02/28/2017   She plans to breastfeed She desires bilateral tubal ligation for postpartum contraception.   Prenatal labs and studies: ABO, Rh: --/--/A POS, A POS (05/25 0940) Antibody: NEG (05/25 0940) Rubella: Immune (01/04 0000) RPR: Non Reactive (05/25 0940)  HBsAg: Negative (01/04 0000)  HIV: Non Reactive (04/02 0828)  JYN:WGNFAOZH (05/14 0801) 2 hr Glucola  WNL Genetic screening normal Anatomy US normal  Prenatal Transfer Tool  Maternal Diabetes: No Genetic Screening: Normal Maternal Ultrasounds/Referrals: Normal Fetal Ultrasounds or other Referrals:  None Maternal Substance Abuse:  No Significant Maternal Medications:  None Significant Maternal Lab Results: None  No past medical history on file.  Past Surgical History:  Procedure Laterality Date  . CESAREAN SECTION     x2    OB History  Gravida Para Term Preterm AB Living  3 2 2  0 0 2  SAB TAB Ectopic Multiple Live Births  0       2    # Outcome Date GA Lbr Len/2nd Weight Sex Delivery Anes PTL Lv  3 Current           2 Term 07/2015     CS-LTranv     1 Term 12/2011 [redacted]w[redacted]d  7 lb 11 oz (3.487 kg) M CS-LTranv EPI       Birth Comments: C/S FTP      Social History   Social History  . Marital status: Single    Spouse name: N/A  . Number of children: N/A  . Years of  education: N/A   Social History Main Topics  . Smoking status: Never Smoker  . Smokeless tobacco: Never Used  . Alcohol use No  . Drug use: No  . Sexual activity: Yes   Other Topics Concern  . Not on file   Social History Narrative  . No narrative on file    Family History  Problem Relation Age of Onset  . Hypertension Father   . Diabetes Paternal Aunt   . Hypertension Paternal Aunt     Prescriptions Prior to Admission  Medication Sig Dispense Refill Last Dose  . acetaminophen (TYLENOL) 500 MG tablet Take 1,000 mg by mouth daily as needed for mild pain, moderate pain or headache.    Taking  . Prenatal Vit-Fe Fumarate-FA (PRENATAL PO) Take 1 tablet by mouth daily.       No Known Allergies  Review of Systems: Negative except for what is mentioned in HPI.  Physical Exam: BP 138/79   Pulse (!) 102   Temp 97.7 F (36.5 C) (Oral)   Resp 16   Ht 4\' 11"  (1.499 m)   Wt 235 lb (106.6 kg)   LMP 08/15/2016 (Exact Date)   BMI 47.46 kg/m  FHR by Doppler: + GENERAL: Well-developed, well-nourished female in no acute distress.  LUNGS: Clear to auscultation bilaterally.  HEART: Regular rate and rhythm. ABDOMEN: Soft, nontender, nondistended, gravid, well-healed Pfannenstiel incision.  PELVIC: Deferred EXTREMITIES: Nontender, no edema, 2+ distal pulses.   Pertinent Labs/Studies:   Results for orders placed or performed during the hospital encounter of 05/16/17 (from the past 72 hour(s))  CBC     Status: None   Collection Time: 05/16/17  9:40 AM  Result Value Ref Range   WBC 10.3 4.0 - 10.5 K/uL   RBC 4.18 3.87 - 5.11 MIL/uL   Hemoglobin 12.5 12.0 - 15.0 g/dL   HCT 16.138.5 09.636.0 - 04.546.0 %   MCV 92.1 78.0 - 100.0 fL   MCH 29.9 26.0 - 34.0 pg   MCHC 32.5 30.0 - 36.0 g/dL   RDW 40.914.6 81.111.5 - 91.415.5 %   Platelets 272 150 - 400 K/uL  Type and screen     Status: None   Collection Time: 05/16/17  9:40 AM  Result Value Ref Range   ABO/RH(D) A POS    Antibody Screen NEG    Sample  Expiration 05/19/2017   RPR     Status: None   Collection Time: 05/16/17  9:40 AM  Result Value Ref Range   RPR Ser Ql Non Reactive Non Reactive    Comment: (NOTE) Performed At: [facility] [address] [place], [place] [id number] Mila HomerHancock William F MD YQ:[id number]:[phone]   ABO/Rh     Status: None   Collection Time: 05/16/17  9:40 AM  Result Value Ref Range   ABO/RH(D) A POS     Assessment and Plan :Kaitlyn Irwin is a 21 y.o. G3P2002 at 3661w3d being admitted being admitted for scheduled cesarean section with bilateral tubla ligation. The risks of cesarean section discussed with the patient included but were not limited to: bleeding which may require transfusion or reoperation; infection which may require antibiotics; injury to bowel, bladder, ureters or other surrounding organs; injury to the fetus; need for additional procedures including hysterectomy in the event of a life-threatening hemorrhage; placental abnormalities wth subsequent pregnancies, incisional problems, thromboembolic phenomenon and other postoperative/anesthesia complications. The patient concurred with the proposed plan, giving informed written consent for the procedure. Patient has been NPO since last night she will remain NPO for procedure. Anesthesia and OR aware. Preoperative prophylactic antibiotics and SCDs ordered on call to the OR. To OR when ready.   Given pts age, I clearly emphasized the permanent nature of the BTL. I offered her reversible forms of contraception namely the LnIUD. Pt declined and would like to proceed with the BTL at the time of the c-section. Her [place] papers are signed and on the chart.   Drevin Ortner L. Harraway-Smith, M.D., Evern CoreFACOG

## 2017-05-18 NOTE — Transfer of Care (Signed)
Immediate Anesthesia Transfer of Care Note  Patient: Kaitlyn Irwin  Procedure(s) Performed: Procedure(s): REPEAT CESAREAN SECTION (N/A)  Patient Location: PACU  Anesthesia Type:Spinal  Level of Consciousness: awake, alert  and oriented  Airway & Oxygen Therapy: Patient Spontanous Breathing  Post-op Assessment: Report given to RN and Post -op Vital signs reviewed and stable  Post vital signs: Reviewed and stable  Last Vitals:  Vitals:   05/18/17 0840  BP: 138/79  Pulse: (!) 102  Resp: 16  Temp: 36.5 C    Last Pain:  Vitals:   05/18/17 0840  TempSrc: Oral         Complications: No apparent anesthesia complications

## 2017-05-18 NOTE — Consult Note (Signed)
Neonatology Note:   Attendance at C-section:    I was asked by Dr. Erin FullingHarraway-Smith to attend this repeat C/S at term. The mother is a G3P2002, GBS + with good prenatal care. ROM at delivery, fluid clear. Infant vigorous with good spontaneous cry and tone. Received minimal bulb suctioning.  Did not pick up by 5min so pulse ox placed on Sao2 in 70s.  Received ~1 minute of BBO2 with so chest PT.  Comfortable WOB; intermittant tachypnea. Sao2 in 90 and stable on removal of suppl O2.  Ap 8/8. Lungs clearing to ausc in DR. To CN to care of Pediatrician.    Dineen Kidavid C. Leary RocaEhrmann, MD

## 2017-05-18 NOTE — Anesthesia Postprocedure Evaluation (Addendum)
Anesthesia Post Note  Patient: Kaitlyn Irwin  Procedure(s) Performed: Procedure(s) (LRB): REPEAT CESAREAN SECTION (N/A) BILATERAL TUBAL STERILIZATION  Patient location during evaluation: Mother Baby Anesthesia Type: Spinal Level of consciousness: awake Pain management: pain level controlled Vital Signs Assessment: post-procedure vital signs reviewed and stable Respiratory status: spontaneous breathing Cardiovascular status: stable Postop Assessment: no headache, no backache, spinal receding, patient able to bend at knees, no signs of nausea or vomiting and adequate PO intake Anesthetic complications: no        Last Vitals:  Vitals:   05/18/17 1315 05/18/17 1415  BP: (!) 118/57 (!) 103/49  Pulse: 81 82  Resp: 16 16  Temp:  36.9 C    Last Pain:  Vitals:   05/18/17 1415  TempSrc: Oral  PainSc: 0-No pain   Pain Goal:                 MULLINS,JANET

## 2017-05-18 NOTE — Progress Notes (Signed)
Pt arrived in room.  Pt asked to change into pt gown

## 2017-05-18 NOTE — Progress Notes (Signed)
Dr Gomez CleverlyPickins notified of conversation with pt

## 2017-05-18 NOTE — Lactation Note (Signed)
This note was copied from a baby's chart. Lactation Consultation Note  P3, baby 5 hours old.  Mother states she had difficulty latching first baby and she pumped a few times and received no breastmilk. She states with her second baby she became engorged and could not breastfeed and did not have a pump. She states she now has personal DEBP but has been set up w/ DEBP in room. Provided education how breastmilk comes to volume, supply and demand. Baby recently bf for 16 min and is sleepy on mother's breast.  Suggest changing diaper and waking baby to feed on other breast. Reviewed hand expression and glistening expressed.  Mother had pumpedx1 and received drops. Encouraged post pumping 4-5 times a day for 10-15 min to help milk supply after breastfeeding. Assisted w/ latching baby.  Intermittent sucks and swallows observed for approx 8 min. Discussed bascis. Mom made aware of O/P services, breastfeeding support groups, community resources, and our phone # for post-discharge questions.     Patient Name: Kaitlyn Irwin WUJWJ'XToday's Date: 05/18/2017 Reason for consult: Initial assessment   Maternal Data Has patient been taught Hand Expression?: Yes Does the patient have breastfeeding experience prior to this delivery?: Yes  Feeding Feeding Type: Breast Fed Length of feed: 8 min  LATCH Score/Interventions Latch: Grasps breast easily, tongue down, lips flanged, rhythmical sucking.  Audible Swallowing: A few with stimulation Intervention(s): Skin to skin  Type of Nipple: Everted at rest and after stimulation  Comfort (Breast/Nipple): Soft / non-tender     Hold (Positioning): Assistance needed to correctly position infant at breast and maintain latch. Intervention(s): Breastfeeding basics reviewed  LATCH Score: 8  Lactation Tools Discussed/Used Pump Review: Setup, frequency, and cleaning Date initiated:: 05/18/17   Consult Status Consult Status: Follow-up Date:  05/19/17 Follow-up type: In-patient    Dahlia ByesBerkelhammer, Ruth [facility] HospitalBoschen 05/18/2017, 3:06 PM

## 2017-05-18 NOTE — Anesthesia Procedure Notes (Signed)
Spinal  Patient location during procedure: OR Start time: 05/18/2017 9:33 AM End time: 05/18/2017 9:35 AM Staffing Anesthesiologist: Suella Broad D Performed: anesthesiologist  Preanesthetic Checklist Completed: patient identified, site marked, surgical consent, pre-op evaluation, timeout performed, IV checked, risks and benefits discussed and monitors and equipment checked Spinal Block Patient position: sitting Prep: Betadine Patient monitoring: heart rate, continuous pulse ox, blood pressure and cardiac monitor Approach: midline Location: L4-5 Injection technique: single-shot Needle Needle type: Introducer and Pencan  Needle gauge: 24 G Needle length: 9 cm Additional Notes Negative paresthesia. Negative blood return. Positive free-flowing CSF. Expiration date of kit checked and confirmed. Patient tolerated procedure well, without complications.

## 2017-05-18 NOTE — Brief Op Note (Signed)
05/18/2017  10:41 AM  PATIENT:  Lavena Bullionamara Galasso  21 y.o. female  PRE-OPERATIVE DIAGNOSIS:  repeat c-section undesired fertility  POST-OPERATIVE DIAGNOSIS:  repeat c-section undesired fertility  PROCEDURE:  Procedure(s): REPEAT CESAREAN SECTION (N/A)  SURGEON:  Surgeon(s) and Role:    * Willodean RosenthalHarraway-Smith, Kristle Wesch, MD - Primary  ANESTHESIA:   spinal  EBL:  Total I/O In: 2500 [I.V.:2500] Out: 700 [Urine:100; Blood:600]  BLOOD ADMINISTERED:none  DRAINS: none   LOCAL MEDICATIONS USED:  MARCAINE     SPECIMEN:  Source of Specimen:  placenta  DISPOSITION OF SPECIMEN:  Labor and delivery  COUNTS:  YES  TOURNIQUET:  * No tourniquets in log *  DICTATION: .Note written in EPIC  PLAN OF CARE: Admit to inpatient   PATIENT DISPOSITION:  PACU - hemodynamically stable.   Delay start of Pharmacological VTE agent (>24hrs) due to surgical blood loss or risk of bleeding: yes  Complications: none immediate  Lachlyn Vanderstelt L. Harraway-Smith, M.D., Evern CoreFACOG

## 2017-05-18 NOTE — Progress Notes (Signed)
Dr Vergie LivingPickens called requested pre-op orders be placed in computer

## 2017-05-18 NOTE — Addendum Note (Signed)
Addendum  created 05/18/17 1517 by Renford DillsMullins, Verl Whitmore L, CRNA   Sign clinical note

## 2017-05-18 NOTE — Progress Notes (Signed)
Pt called at 0800.  Pt states she was told to come to hospital at 0900 with surgery at 1100.  Also states she is on her way.  Called attending (Dr Vergie LivingPickens) He requested that I call him back.

## 2017-05-18 NOTE — OR Nursing (Addendum)
Call to Dr Vergie LivingPickens to get preop orders at 2345. Only medication orders in. He states orders in.

## 2017-05-18 NOTE — Anesthesia Postprocedure Evaluation (Signed)
Anesthesia Post Note  Patient: Kaitlyn Irwin  Procedure(s) Performed: Procedure(s) (LRB): REPEAT CESAREAN SECTION (N/A) BILATERAL TUBAL STERILIZATION  Patient location during evaluation: PACU Anesthesia Type: Spinal Level of consciousness: oriented and awake and alert Pain management: pain level controlled Vital Signs Assessment: post-procedure vital signs reviewed and stable Respiratory status: spontaneous breathing, respiratory function stable and patient connected to nasal cannula oxygen Cardiovascular status: blood pressure returned to baseline and stable Postop Assessment: no headache, no backache, spinal receding and patient able to bend at knees Anesthetic complications: no        Last Vitals:  Vitals:   05/18/17 1130 05/18/17 1135  BP: (!) 112/59   Pulse: 76 69  Resp: 12 20  Temp:      Last Pain:  Vitals:   05/18/17 1130  TempSrc:   PainSc: 0-No pain   Pain Goal:                 Shelton SilvasKevin D Kishan Wachsmuth

## 2017-05-19 LAB — CBC
HCT: 31.3 % — ABNORMAL LOW (ref 36.0–46.0)
Hemoglobin: 9.9 g/dL — ABNORMAL LOW (ref 12.0–15.0)
MCH: 29.9 pg (ref 26.0–34.0)
MCHC: 31.6 g/dL (ref 30.0–36.0)
MCV: 94.6 fL (ref 78.0–100.0)
Platelets: 205 K/uL (ref 150–400)
RBC: 3.31 MIL/uL — ABNORMAL LOW (ref 3.87–5.11)
RDW: 14.9 % (ref 11.5–15.5)
WBC: 11.1 K/uL — ABNORMAL HIGH (ref 4.0–10.5)

## 2017-05-19 NOTE — Progress Notes (Signed)
Subjective: Postpartum Day 1: Cesarean Delivery Patient reports incisional pain, tolerating PO and + flatus.    Objective: Vital signs in last 24 hours: Temp:  [97.7 F (36.5 C)-98.7 F (37.1 C)] 98.1 F (36.7 C) (05/28 0328) Pulse Rate:  [69-102] 80 (05/28 0328) Resp:  [12-29] 20 (05/28 0328) BP: (103-138)/(47-85) 119/57 (05/28 0328) SpO2:  [98 %-100 %] 99 % (05/28 0328) Weight:  [235 lb (106.6 kg)] 235 lb (106.6 kg) (05/27 [id number])  Physical Exam:  General: alert, cooperative and no distress Lochia: appropriate Uterine Fundus: firm Incision: no significant drainage, no dehiscence, no significant erythema DVT Evaluation: No evidence of DVT seen on physical exam.   Recent Labs  05/16/17 0940 05/19/17 0535  HGB 12.5 9.9*  HCT 38.5 31.3*    Assessment/Plan: Status post Cesarean section. Doing well postoperatively.  Continue current care.  Kaitlyn Irwin 05/19/2017, 6:43 AM

## 2017-05-19 NOTE — Progress Notes (Signed)
UR chart review completed.  

## 2017-05-20 NOTE — Lactation Note (Signed)
This note was copied from a baby's chart. Lactation Consultation Note  Patient Name: [name] WGNFA'OToday's Date: 05/20/2017   P3, baby 2347 hours old, recently had formula bottle but is still cueing. Mother states she wants to wait to breastfeed until her milk transitions. Recommend breastfeeding before offering formula to help establish her milk supply. Since baby was cueing suggested to mother to offer breast. Baby latched easily but seems sleepy due to recently bottle of formula. Reviewed engorgement care and monitoring voids/stools.       Maternal Data    Feeding Feeding Type: Bottle Fed - Formula Nipple Type: Slow - flow  LATCH Score/Interventions                      Lactation Tools Discussed/Used     Consult Status      Hardie PulleyBerkelhammer, Ruth Boschen 05/20/2017, 9:52 AM

## 2017-05-20 NOTE — Discharge Summary (Signed)
OB Discharge Summary  Patient Name: Kaitlyn Irwin DOB: [birth year] MRN: [id number]  Date of admission: 05/18/2017 Delivering MD: Willodean Rosenthal   Date of discharge: 05/20/2017  Admitting diagnosis: repeat c-section Intrauterine pregnancy: [redacted]w[redacted]d     Secondary diagnosis:Principal Problem:   Previous cesarean delivery affecting pregnancy, antepartum Active Problems:   History of group B Streptococcus (GBS) infection   Family history of Downs syndrome   Supervision of normal pregnancy   Maternal morbid obesity, antepartum (HCC)   Post-operative state  Additional problems: None     Discharge diagnosis: Term Pregnancy Delivered                                                                     Post partum procedures:postpartum tubal ligation  Augmentation: None, repeat CS  Complications: None  Hospital course:  Sceduled C/S   21 y.o. yo G3P3003 at [redacted]w[redacted]d was admitted to the hospital 05/18/2017 for scheduled cesarean section with the following indication:Elective Repeat.  Membrane Rupture Time/Date: 9:59 AM ,05/18/2017   Patient delivered a Viable infant.05/18/2017  Details of operation can be found in separate operative note.  Pateint had an uncomplicated postpartum course.  She is ambulating, tolerating a regular diet, passing flatus, and urinating well. Patient is discharged home in stable condition on  05/20/17         Physical exam  Vitals:   05/19/17 1107 05/19/17 1732 05/19/17 1800 05/20/17 0641  BP: (!) 127/59  (!) 145/92 125/72  Pulse: 80  83 88  Resp: 20  20 18   Temp: 97.5 F (36.4 C) 98.5 F (36.9 C) 97.9 F (36.6 C) 98.1 F (36.7 C)  TempSrc: Oral Axillary Oral Oral  SpO2:    99%  Weight:      Height:       General: alert, cooperative and no distress Lochia: appropriate Uterine Fundus: firm Incision: Healing well with no significant drainage, No significant erythema, Dressing is clean, dry, and intact DVT Evaluation: No evidence of DVT seen on  physical exam. No cords or calf tenderness. No significant calf/ankle edema. Labs: Lab Results  Component Value Date   WBC 11.1 (H) 05/19/2017   HGB 9.9 (L) 05/19/2017   HCT 31.3 (L) 05/19/2017   MCV 94.6 05/19/2017   PLT 205 05/19/2017   No flowsheet data found.  Discharge instruction: per After Visit Summary and "Baby and Me Booklet".  After Visit Meds:  Allergies as of 05/20/2017   No Known Allergies     Medication List    TAKE these medications   acetaminophen 500 MG tablet Commonly known as:  TYLENOL Take 1,000 mg by mouth daily as needed for mild pain, moderate pain or headache.   PRENATAL PO Take 1 tablet by mouth daily.       Diet: routine diet  Activity: Advance as tolerated. Pelvic rest for 6 weeks.   Outpatient follow up:2 weeks Follow up Appt:Future Appointments Date Time Provider Department Center  06/18/2017 2:00 PM Marylene Land, CNM [facility]   Follow up visit: No Follow-up on file.  Postpartum contraception: Tubal Ligation  Newborn Data: Live born female  Birth Weight: 7 lb 4.4 oz (3300 g) APGAR: 8, 8  Baby Feeding: Breast Disposition:home with mother   05/20/2017 [name]  Sammie BenchJ Cordie Beazley, MD

## 2017-05-20 NOTE — Plan of Care (Signed)
Problem: Activity: Goal: Will verbalize the importance of balancing activity with adequate rest periods Outcome: Completed/Met Date Met: 05/20/17 Pt ambulating in room and tolerating increased activity well.  Problem: Bowel/Gastric: Goal: Gastrointestinal status will improve Outcome: Completed/Met Date Met: 05/20/17 Pt passing gas and has good bowel sounds.  Encouraged ambulation to continue to improve GI status.

## 2017-05-21 ENCOUNTER — Encounter (HOSPITAL_COMMUNITY): Payer: Self-pay

## 2017-05-21 NOTE — Lactation Note (Signed)
Lactation Consultation Note: Mother reports that her nipples are sore and that is why she is bottle feeding.  Observed nipples and mother has bilateral cracks. She reports that she is planning to continue to pump. She is unsure if she wants to latch infant. She reports that she will page for assistance with next feeding. Father has just recently given infant a bottle. Mother reports that she pumped her breast but is only getting a few drops of colostrum. Mother reports that she has a pump at home.   Returned to mothers room for follow up and mother reports that she plans to only pump . Discussed treatment to prevent engorgement. Advised mother to do good breast massage and to pump every 2-3 hours for 15-20 mins. Mother receptive to all teaching.   Patient Name: Kaitlyn Bullionamara Champagne [id number]'QToday's Date: 05/21/2017     Maternal Data    Feeding    LATCH Score/Interventions                      Lactation Tools Discussed/Used     Consult Status      Michel BickersKendrick, Tereasa Yilmaz McCoy 05/21/2017, 9:24 AM

## 2017-05-23 NOTE — Addendum Note (Signed)
Addendum  created 05/23/17 1309 by Shelton SilvasHollis, Ramey Schiff D, MD   Sign clinical note

## 2017-06-09 ENCOUNTER — Inpatient Hospital Stay (HOSPITAL_COMMUNITY)
Admission: AD | Admit: 2017-06-09 | Discharge: 2017-06-09 | Disposition: A | Discharge status: Home / Self Care | Payer: Medicaid Other | Source: Ambulatory Visit | Attending: Obstetrics and Gynecology | Admitting: Obstetrics and Gynecology

## 2017-06-09 ENCOUNTER — Encounter (HOSPITAL_COMMUNITY): Payer: Self-pay | Admitting: *Deleted

## 2017-06-09 DIAGNOSIS — Z9889 Other specified postprocedural states: Secondary | ICD-10-CM

## 2017-06-09 DIAGNOSIS — Z5189 Encounter for other specified aftercare: Secondary | ICD-10-CM

## 2017-06-09 DIAGNOSIS — Z4889 Encounter for other specified surgical aftercare: Secondary | ICD-10-CM | POA: Diagnosis present

## 2017-06-09 NOTE — MAU Note (Signed)
Pt s/p c-section on 05/27, reports she started seeing some bloody discharge from her incision on the right side. The discharge has continued and it now has an odor.

## 2017-06-09 NOTE — Discharge Instructions (Signed)
Wound Check  If you have a wound, it may take some time to heal. Eventually, a scar will form. The scar will also fade with time. It is important to take care of your wound while it is healing. This helps to protect your wound from infection.  How should I take care of my wound at home?   Some wounds are allowed to close on their own or are repaired at a later date. There are many different ways to close and cover a wound, including stitches (sutures), skin glue, and adhesive strips. Follow your health care provider's instructions about:  ? Wound care.  ? Bandage (dressing) changes and removal.  ? Wound closure removal.   Take medicines only as directed by your health care provider.   Keep all follow-up visits as directed by your health care provider. This is important.   Do not take baths, swim, or use a hot tub until your health care provider approves. You may shower as directed by your health care provider.   Keep your wound clean and dry.  What affects scar formation?  Scars affect each person differently. How your body scars depends on:   The location and size of your wound.   Traits that you inherited from your parents (genetic predisposition).   How you take care of your wound. Irritation and inflammation increase the amount of scar formation.   Sun exposure. This can darken a scar.    When should I call or see my health care provider?  Call or see your health care provider if:   You have redness, swelling, or pain at your wound site.   You have fluid, blood, or pus coming from your wound.   You have muscle aches, chills, or a general ill feeling.   You notice a bad smell coming from the wound.   Your wound separates after the sutures, staples, or skin adhesive strips have been removed.   You have persistent nausea or vomiting.   You have a fever.   You are dizzy.    When should I call 911 or go to the emergency room?  Call 911 or go to the emergency room if:   You faint.   You have  difficulty breathing.    This information is not intended to replace advice given to you by your health care provider. Make sure you discuss any questions you have with your health care provider.  Document Released: 09/14/2004 Document Revised: 05/22/2016 Document Reviewed: 09/20/2014  Elsevier Interactive Patient Education  2018 [employer]

## 2017-06-09 NOTE — MAU Provider Note (Signed)
  History     CSN: [id number]  Arrival date and time: 06/09/17 1702   None     No chief complaint on file.  HPI 21 yo G3P3 s/p repeat c-section on 5/27 presenting today for the evaluation of her incision. Patient reports that her pain is well controlled with ibuprofen. Over the past few days, she noted some blood oozing from the right side of her incision along with a foul odor. She denies any fever or purulent drainage from her incision     No past medical history on file.  Past Surgical History:  Procedure Laterality Date  . CESAREAN SECTION     x2  . CESAREAN SECTION N/A 05/18/2017   Procedure: REPEAT CESAREAN SECTION;  Surgeon: Willodean RosenthalHarraway-Smith, Carolyn, MD;  [facility];  Service: Obstetrics;  Laterality: N/A;  . TUBAL LIGATION  05/18/2017   Procedure: BILATERAL TUBAL STERILIZATION;  Surgeon: Willodean RosenthalHarraway-Smith, Carolyn, MD;  [facility];  Service: Obstetrics;;    Family History  Problem Relation Age of Onset  . Hypertension Father   . Diabetes Paternal Aunt   . Hypertension Paternal Aunt     Social History  Substance Use Topics  . Smoking status: Never Smoker  . Smokeless tobacco: Never Used  . Alcohol use No    Allergies: No Known Allergies  Prescriptions Prior to Admission  Medication Sig Dispense Refill Last Dose  . acetaminophen (TYLENOL) 500 MG tablet Take 1,000 mg by mouth daily as needed for mild pain, moderate pain or headache.    Taking  . Prenatal Vit-Fe Fumarate-FA (PRENATAL PO) Take 1 tablet by mouth daily.       Review of Systems  See pertinent in HPI Physical Exam   Blood pressure 125/76, pulse 72, temperature 98.4 F (36.9 C), temperature source Oral, resp. rate 16, SpO2 100 %, unknown if currently breastfeeding.  Physical Exam GENERAL: Well-developed, well-nourished female in no acute distress.  LUNGS: Clear to auscultation bilaterally.  HEART: Regular rate and rhythm. ABDOMEN: Soft, nontender, nondistended.  No organomegaly. Incision: no erythema, induration or drainage. 2 areas of poorly reaproximated skin on the right side of the incision. No expresable drainage. No bleeding.  PELVIC: Not indicated EXTREMITIES: No cyanosis, clubbing, or edema, 2+ distal pulses.  MAU Course  Procedures  MDM   Assessment and Plan  21 yo s/p repeat c-section with BTL on 5/27 with normal healing incision - no evidence of wound infection - Discussed normal healing course - Wound care precautions reviewed. Advised patient to keep incision clean and dry at tall times. Given that she has a panus, advised patient to apply a pad or towel to absorb moisture. This will help minimize odor and prevent wound infection - Follow up as scheduled for post partum care - Return to MAU precautions reviewed  Rovena Hearld 06/09/2017, 5:36 PM

## 2017-06-18 ENCOUNTER — Ambulatory Visit: Payer: Medicaid Other | Admitting: Student

## 2018-03-02 ENCOUNTER — Encounter: Payer: Self-pay | Admitting: Obstetrics & Gynecology

## 2018-03-02 ENCOUNTER — Ambulatory Visit (INDEPENDENT_AMBULATORY_CARE_PROVIDER_SITE_OTHER): Payer: Medicaid Other | Admitting: Obstetrics & Gynecology

## 2018-03-02 VITALS — BP 124/82 | HR 88 | Ht 59.0 in | Wt 208.2 lb

## 2018-03-02 DIAGNOSIS — N920 Excessive and frequent menstruation with regular cycle: Secondary | ICD-10-CM

## 2018-03-02 MED ORDER — NORETHIN ACE-ETH ESTRAD-FE 1-20 MG-MCG(24) PO TABS: 1.0000 | ORAL_TABLET | Freq: Every day | ORAL | 11 refills | Status: AC

## 2018-03-02 NOTE — Progress Notes (Signed)
Pt states since she had son in May 2018 has been having problems with cycle.Before delivery would only be on period fopr 3 to 4 days but now 5 to 7 days.The first 3 days she soaks her pads within 30 mins.then it stops for a day and restart the next day not as heavy.

## 2018-03-02 NOTE — Progress Notes (Signed)
History:  [name] here today for AUB. Pt reports heavy menses since her last c-section with BTL. Pt reports that after her c-section in May she does not recall when her period actually started. She is not nursing. She did nurse for 2 month. This has cont for 10 months. Pt does not want an IUD and does not want to use Depo Provera.  Pt denies bleeding between cycles.     The following portions of the patient's history were reviewed and updated as appropriate: allergies, current medications, past family history, past medical history, past social history, past surgical history and problem list.  Review of Systems:  Pertinent items are noted in HPI.   Objective:  Physical Exam Blood pressure 124/82, pulse 88, height 4\' 11"  (1.499 m), weight 208 lb 3.2 oz (94.4 kg), last menstrual period 02/26/2018, not currently breastfeeding.  CONSTITUTIONAL: Well-developed, well-nourished female in no acute distress.  HENT:  Normocephalic, atraumatic EYES: Conjunctivae and EOM are normal. No scleral icterus.  NECK: Normal range of motion SKIN: Skin is warm and dry. No rash noted. Not diaphoretic.No pallor. NEUROLGIC: Alert and oriented to person, place, and time. Normal coordination.    Assessment & Plan:  AUB- discussed with pt tx options including a LnIUD, Depo Provera and OCPs. Pt opts for OCPs    Loestrin 1/20 1 po g day- no contraindications  Labs: CBC and TSH F/u 3 months or sooner prn  Total face-to-face time with patient was 15 min.  Greater than 50% was spent in counseling and coordination of care with the patient.    Kaitlyn Irwin Kaitlyn Irwin, M.D., Evern CoreFACOG

## 2018-03-03 LAB — CBC
HEMATOCRIT: 40.2 % (ref 34.0–46.6)
HEMOGLOBIN: 13.2 g/dL (ref 11.1–15.9)
MCH: 30 pg (ref 26.6–33.0)
MCHC: 32.8 g/dL (ref 31.5–35.7)
MCV: 91 fL (ref 79–97)
Platelets: 433 10*3/uL — ABNORMAL HIGH (ref 150–379)
RBC: 4.4 x10E6/uL (ref 3.77–5.28)
RDW: 12.6 % (ref 12.3–15.4)
WBC: 10.9 10*3/uL — ABNORMAL HIGH (ref 3.4–10.8)

## 2018-03-03 LAB — TSH: TSH: 1.46 u[IU]/mL (ref 0.450–4.500)

## 2018-05-31 ENCOUNTER — Encounter (HOSPITAL_COMMUNITY): Payer: Self-pay | Admitting: Emergency Medicine

## 2018-05-31 ENCOUNTER — Emergency Department (HOSPITAL_COMMUNITY)
Admission: EM | Admit: 2018-05-31 | Discharge: 2018-06-01 | Disposition: A | Discharge status: Home / Self Care | Payer: Medicaid Other | Attending: Emergency Medicine | Admitting: Emergency Medicine

## 2018-05-31 DIAGNOSIS — R1011 Right upper quadrant pain: Secondary | ICD-10-CM | POA: Insufficient documentation

## 2018-05-31 DIAGNOSIS — Z79899 Other long term (current) drug therapy: Secondary | ICD-10-CM | POA: Diagnosis not present

## 2018-05-31 DIAGNOSIS — K8020 Calculus of gallbladder without cholecystitis without obstruction: Secondary | ICD-10-CM

## 2018-05-31 DIAGNOSIS — K8050 Calculus of bile duct without cholangitis or cholecystitis without obstruction: Secondary | ICD-10-CM

## 2018-05-31 LAB — CBC
HCT: 44.1 % (ref 36.0–46.0)
Hemoglobin: 13.8 g/dL (ref 12.0–15.0)
MCH: 29.4 pg (ref 26.0–34.0)
MCHC: 31.3 g/dL (ref 30.0–36.0)
MCV: 94 fL (ref 78.0–100.0)
PLATELETS: 444 10*3/uL — AB (ref 150–400)
RBC: 4.69 MIL/uL (ref 3.87–5.11)
RDW: 11.9 % (ref 11.5–15.5)
WBC: 11.9 10*3/uL — ABNORMAL HIGH (ref 4.0–10.5)

## 2018-05-31 LAB — URINALYSIS, ROUTINE W REFLEX MICROSCOPIC
Bilirubin Urine: NEGATIVE
Glucose, UA: NEGATIVE mg/dL
KETONES UR: NEGATIVE mg/dL
Leukocytes, UA: NEGATIVE
Nitrite: NEGATIVE
PH: 5 (ref 5.0–8.0)
Protein, ur: NEGATIVE mg/dL
SPECIFIC GRAVITY, URINE: 1.03 (ref 1.005–1.030)

## 2018-05-31 LAB — COMPREHENSIVE METABOLIC PANEL
ALBUMIN: 3.4 g/dL — AB (ref 3.5–5.0)
ALK PHOS: 82 U/L (ref 38–126)
ALT: 18 U/L (ref 14–54)
AST: 19 U/L (ref 15–41)
Anion gap: 11 (ref 5–15)
BUN: 6 mg/dL (ref 6–20)
CALCIUM: 9 mg/dL (ref 8.9–10.3)
CO2: 22 mmol/L (ref 22–32)
Chloride: 105 mmol/L (ref 101–111)
Creatinine, Ser: 0.67 mg/dL (ref 0.44–1.00)
GFR calc Af Amer: >60 mL/min (ref >60)
GFR calc non Af Amer: >60 mL/min (ref >60)
GLUCOSE: 96 mg/dL (ref 65–99)
Potassium: 4 mmol/L (ref 3.5–5.1)
SODIUM: 138 mmol/L (ref 135–145)
Total Bilirubin: 0.6 mg/dL (ref 0.3–1.2)
Total Protein: 7 g/dL (ref 6.5–8.1)

## 2018-05-31 LAB — LIPASE, BLOOD: Lipase: 24 U/L (ref 11–51)

## 2018-05-31 LAB — I-STAT BETA HCG BLOOD, ED (MC, WL, AP ONLY)

## 2018-05-31 NOTE — ED Triage Notes (Signed)
Pt presents to ED for assessment of epigastric pain radiating to her upper back, episode woke her up this morning from dead sleep.  Lasted until she was able to fall back asleep, then another episode tonight starting 2 hours ago.  Denies n/v/d.  C/o radiation to chest.

## 2018-06-01 ENCOUNTER — Emergency Department (HOSPITAL_COMMUNITY): Payer: Medicaid Other

## 2018-06-01 MED ORDER — ONDANSETRON 8 MG PO TBDP: ORAL_TABLET | ORAL | 0 refills | Status: AC

## 2018-06-01 MED ORDER — ONDANSETRON HCL 4 MG/2ML IJ SOLN
4.0000 mg | Freq: Once | INTRAMUSCULAR | Status: AC
  Administered 2018-06-01: 4 mg via INTRAVENOUS
  Filled 2018-06-01: qty 2

## 2018-06-01 MED ORDER — FENTANYL CITRATE (PF) 100 MCG/2ML IJ SOLN
50.0000 ug | Freq: Once | INTRAMUSCULAR | Status: AC
  Administered 2018-06-01: 50 ug via INTRAVENOUS
  Filled 2018-06-01: qty 2

## 2018-06-01 MED ORDER — HYDROCODONE-ACETAMINOPHEN 5-325 MG PO TABS: 1.0000 | ORAL_TABLET | ORAL | 0 refills | Status: DC | PRN

## 2018-06-01 NOTE — ED Notes (Addendum)
Pt complains of upper abd pains bilaterally that started early yesterday morning. Pt states she took aleve and the pain went away but was awoken by chest pains later in the morning. Pt denies pain during urination. Pt also reports that she is having normal bowel and urinary movements.

## 2018-06-01 NOTE — ED Provider Notes (Signed)
[facility] EMERGENCY DEPARTMENT Provider Note   CSN: [id number] Arrival date & time: 05/31/18  2122   History   Chief Complaint Chief Complaint  Patient presents with  . Abdominal Pain    HPI Kaitlyn Irwin is a 22 y.o. female.  The history is provided by the patient.  Abdominal Pain   This is a new problem. The current episode started 6 to 12 hours ago. The problem occurs constantly. The problem has been gradually worsening. The pain is located in the LUQ and RUQ. The pain is moderate. Associated symptoms include nausea. Pertinent negatives include fever, diarrhea, hematochezia, melena, vomiting and dysuria. The symptoms are aggravated by palpation. Nothing relieves the symptoms.  Patient presents with abdominal pain.  She reports onset of abdominal pain after waking.  This has been progressively worsening throughout the day.  She reports nausea without vomiting.  She reports that at times the pain does radiate to her back and has had some pain into her chest.  No fevers.  No cough.  No hemoptysis.  Shortness of breath   PMH-none Patient Active Problem List   Diagnosis Date Noted  . Post-operative state 05/18/2017  . Supervision of normal pregnancy 03/03/2017  . Maternal morbid obesity, antepartum (HCC) 03/03/2017  . Previous cesarean delivery affecting pregnancy, antepartum 03/03/2017  . History of group B Streptococcus (GBS) infection 02/28/2017  . Family history of Downs syndrome 02/28/2017    Past Surgical History:  Procedure Laterality Date  . CESAREAN SECTION     x2  . CESAREAN SECTION N/A 05/18/2017   Procedure: REPEAT CESAREAN SECTION;  Surgeon: Willodean Rosenthal, MD;  Location: [facility] BIRTHING SUITES;  Service: Obstetrics;  Laterality: N/A;  . TUBAL LIGATION  05/18/2017   Procedure: BILATERAL TUBAL STERILIZATION;  Surgeon: Willodean Rosenthal, MD;  Location: [facility] BIRTHING SUITES;  Service: Obstetrics;;     OB History    Gravida  3   Para  3    Term  3   Preterm  0   AB  0   Living  3     SAB  0   TAB      Ectopic      Multiple  0   Live Births  3            Home Medications    Prior to Admission medications   Medication Sig Start Date End Date Taking? Authorizing Provider  acetaminophen (TYLENOL) 500 MG tablet Take 1,000 mg by mouth daily as needed for mild pain, moderate pain or headache.     [provider]  amphetamine-dextroamphetamine (ADDERALL XR) 10 MG 24 hr capsule Take 40 mg by mouth daily.    [provider]  Norethindrone Acetate-Ethinyl Estrad-FE (LOESTRIN 24 FE) 1-20 MG-MCG(24) tablet Take 1 tablet by mouth daily. 03/02/18   Willodean Rosenthal, MD    Family History Family History  Problem Relation Age of Onset  . Hypertension Father   . Diabetes Paternal Aunt   . Hypertension Paternal Aunt     Social History Social History   Tobacco Use  . Smoking status: Never Smoker  . Smokeless tobacco: Never Used  Substance Use Topics  . Alcohol use: No  . Drug use: No     Allergies   Patient has no known allergies.   Review of Systems Review of Systems  Constitutional: Negative for fever.  Respiratory: Negative for cough.   Gastrointestinal: Positive for abdominal pain and nausea. Negative for diarrhea, hematochezia, melena and vomiting.  Genitourinary: Negative for dysuria.  All other systems reviewed and are negative.    Physical Exam Updated Vital Signs BP 119/80   Pulse 82   Temp 98.5 F (36.9 C) (Oral)   Resp 15   SpO2 100%   Physical Exam CONSTITUTIONAL: Well developed/well nourished HEAD: Normocephalic/atraumatic EYES: EOMI/PERRL no icterus ENMT: Mucous membranes moist NECK: supple no meningeal signs SPINE/BACK:entire spine nontender CV: S1/S2 noted, no murmurs/rubs/gallops noted LUNGS: Lungs are clear to auscultation bilaterally, no apparent distress ABDOMEN: soft, moderate right upper quadrant epigastric tenderness, +Murphy sign, no  rebound or guarding, bowel sounds noted throughout abdomen GU:no cva tenderness NEURO: Pt is awake/alert/appropriate, moves all extremitiesx4.  No facial droop.   EXTREMITIES: pulses normal/equal, full ROM SKIN: warm, color normal PSYCH: no abnormalities of mood noted, alert and oriented to situation   ED Treatments / Results  Labs (all labs ordered are listed, but only abnormal results are displayed) Labs Reviewed  COMPREHENSIVE METABOLIC PANEL - Abnormal; Notable for the following components:      Result Value   Albumin 3.4 (*)    All other components within normal limits  CBC - Abnormal; Notable for the following components:   WBC 11.9 (*)    Platelets 444 (*)    All other components within normal limits  URINALYSIS, ROUTINE W REFLEX MICROSCOPIC - Abnormal; Notable for the following components:   APPearance HAZY (*)    Hgb urine dipstick SMALL (*)    Bacteria, UA FEW (*)    All other components within normal limits  LIPASE, BLOOD  I-STAT BETA HCG BLOOD, ED ([facility], [facility], [facility] ONLY)    EKG EKG Interpretation  Date/Time:  Sunday May 31 2018 21:43:35 EDT Ventricular Rate:  73 PR Interval:  142 QRS Duration: 76 QT Interval:  396 QTC Calculation: 436 R Axis:   47 Text Interpretation:  Normal sinus rhythm Normal ECG No previous ECGs available Confirmed by Zadie RhineWickline, Azhar Knope ([id number]) on 06/01/2018 1:38:01 AM   Radiology [place] Abdomen Limited Ruq  Result Date: 06/01/2018 CLINICAL DATA:  Right upper quadrant pain. EXAM: ULTRASOUND ABDOMEN LIMITED RIGHT UPPER QUADRANT COMPARISON:  None. FINDINGS: Gallbladder: Physiologically distended containing intraluminal stones and sludge. No gallbladder wall thickening or pericholecystic fluid. No sonographic Murphy sign noted by sonographer. Common bile duct: Diameter: 4 mm, normal. Liver: No focal lesion identified. Diffusely increased in parenchymal echogenicity. Portal vein is patent on color Doppler imaging with normal direction of blood flow  towards the liver. IMPRESSION: 1. Gallstones and sludge without sonographic findings of acute cholecystitis. No biliary dilatation. 2. Hepatic steatosis. Electronically Signed   By: Rubye OaksMelanie  Ehinger M.D.   On: 06/01/2018 02:41    Procedures Procedures (including critical care time)  Medications Ordered in ED Medications  fentaNYL (SUBLIMAZE) injection 50 mcg (50 mcg Intravenous Given 06/01/18 0230)  ondansetron (ZOFRAN) injection 4 mg (4 mg Intravenous Given 06/01/18 0230)     Initial Impression / Assessment and Plan / ED Course  I have reviewed the triage vital signs and the nursing notes.  Pertinent labs & imaging results that were available during my care of the patient were reviewed by me and considered in my medical decision making (see chart for details).     Patient found to have cholelithiasis without evidence of cholecystitis.  She is feeling improved.  Will refer to general surgery  we discussed strict return precautions  Final Clinical Impressions(s) / ED Diagnoses   Final diagnoses:  RUQ pain  Biliary colic  Gallstones    ED  Discharge Orders        Ordered    HYDROcodone-acetaminophen (NORCO/VICODIN) 5-325 MG tablet  Every 4 hours PRN     06/01/18 0342    ondansetron (ZOFRAN ODT) 8 MG disintegrating tablet     06/01/18 0342       Zadie Rhine, MD 06/01/18 [phone]

## 2018-06-10 ENCOUNTER — Ambulatory Visit: Payer: Medicaid Other | Admitting: Neurology

## 2018-06-12 ENCOUNTER — Encounter (HOSPITAL_COMMUNITY): Payer: Self-pay | Admitting: *Deleted

## 2018-06-12 ENCOUNTER — Ambulatory Visit: Payer: Self-pay | Admitting: Surgery

## 2018-06-12 ENCOUNTER — Other Ambulatory Visit: Payer: Self-pay

## 2018-06-12 NOTE — H&P (View-Only) (Signed)
Surgical H&P  CC: abdominal pain  HPI: This is a very pleasant and otherwise healthy 22-year-old woman who is referred by the emergency department where she presented on June 10 with new onset upper abdominal pain.  The pain has started earlier in the day, it had resolved a little bit with Aleve in a warm bath, but had returned and failed to subside after several hours.  This is associated with nausea but no vomiting or fever.  The pain was in the bilateral upper quadrants.  The patient had continued to worsen, did radiate to her back somewhat into her chest.  Her workup there included a CMP which was normal, as well as a CBC that showed a white count of 11.9 but otherwise unremarkable.  Ultrasound demonstrates gallstones and sludge, no cholecystitis, common bile duct 4 mm.  Hepatic steatosis.  She was discharged.  She has had a couple more bouts of pain somewhat improved with hydrocodone that was prescribed by the ER.  She has previously had a C-section and concomitant tubal ligation but no other abdominal surgeries.  She is a mother to 2 little boy's and currently stays at home with them and does her medical classes online at night.  No tobacco or alcohol abuse.  No Known Allergies  No past medical history on file.  Past Surgical History:  Procedure Laterality Date  . CESAREAN SECTION     x2  . CESAREAN SECTION N/A 05/18/2017   Procedure: REPEAT CESAREAN SECTION;  Surgeon: Harraway-Smith, Carolyn, MD;  Location: [facility] BIRTHING SUITES;  Service: Obstetrics;  Laterality: N/A;  . TUBAL LIGATION  05/18/2017   Procedure: BILATERAL TUBAL STERILIZATION;  Surgeon: Harraway-Smith, Carolyn, MD;  Location: [facility] BIRTHING SUITES;  Service: Obstetrics;;    Family History  Problem Relation Age of Onset  . Hypertension Father   . Diabetes Paternal Aunt   . Hypertension Paternal Aunt     Social History   Socioeconomic History  . Marital status: Married    Spouse name: Not on file  . Number of children:  Not on file  . Years of education: Not on file  . Highest education level: Not on file  Occupational History  . Not on file  Social Needs  . Financial resource strain: Not on file  . Food insecurity:    Worry: Not on file    Inability: Not on file  . Transportation needs:    Medical: Not on file    Non-medical: Not on file  Tobacco Use  . Smoking status: Never Smoker  . Smokeless tobacco: Never Used  Substance and Sexual Activity  . Alcohol use: No  . Drug use: No  . Sexual activity: Yes  Lifestyle  . Physical activity:    Days per week: Not on file    Minutes per session: Not on file  . Stress: Not on file  Relationships  . Social connections:    Talks on phone: Not on file    Gets together: Not on file    Attends religious service: Not on file    Active member of club or organization: Not on file    Attends meetings of clubs or organizations: Not on file    Relationship status: Not on file  Other Topics Concern  . Not on file  Social History Narrative  . Not on file    Current Outpatient Medications on File Prior to Visit  Medication Sig Dispense Refill  . acetaminophen (TYLENOL) 500 MG tablet Take 1,000 mg by   mouth daily as needed for mild pain, moderate pain or headache.     . amphetamine-dextroamphetamine (ADDERALL XR) 10 MG 24 hr capsule Take 40 mg by mouth daily.    . HYDROcodone-acetaminophen (NORCO/VICODIN) 5-325 MG tablet Take 1 tablet by mouth every 4 (four) hours as needed for severe pain. 5 tablet 0  . Norethindrone Acetate-Ethinyl Estrad-FE (LOESTRIN 24 FE) 1-20 MG-MCG(24) tablet Take 1 tablet by mouth daily. 1 Package 11  . ondansetron (ZOFRAN ODT) 8 MG disintegrating tablet 8mg ODT q4 hours prn nausea 4 tablet 0   No current facility-administered medications on file prior to visit.     Review of Systems: a complete, 10pt review of systems was completed with pertinent positives and negatives as documented in the HPI  Physical Exam: There were no  vitals filed for this visit. Gen: A&Ox3, no distress  Head: normocephalic, atraumatic Eyes: extraocular motions intact, anicteric.  Neck: supple without mass or thyromegaly Chest: unlabored respirations, symmetrical air entry, clear bilaterally   Cardiovascular: RRR with palpable distal pulses, no pedal edema Abdomen: soft, nondistended, nontender. No mass or organomegaly.  Extremities: warm, without edema, no deformities  Neuro: grossly intact Psych: appropriate mood and affect, normal insight  Skin: warm and dry   CBC Latest Ref Rng & Units 05/31/2018 03/02/2018 05/19/2017  WBC 4.0 - 10.5 K/uL 11.9(H) 10.9(H) 11.1(H)  Hemoglobin 12.0 - 15.0 g/dL 13.8 13.2 9.9(L)  Hematocrit 36.0 - 46.0 % 44.1 40.2 31.3(L)  Platelets 150 - 400 K/uL 444(H) 433(H) 205    CMP Latest Ref Rng & Units 05/31/2018  Glucose 65 - 99 mg/dL 96  BUN 6 - 20 mg/dL 6  Creatinine 0.44 - 1.00 mg/dL 0.67  Sodium 135 - 145 mmol/L 138  Potassium 3.5 - 5.1 mmol/L 4.0  Chloride 101 - 111 mmol/L 105  CO2 22 - 32 mmol/L 22  Calcium 8.9 - 10.3 mg/dL 9.0  Total Protein 6.5 - 8.1 g/dL 7.0  Total Bilirubin 0.3 - 1.2 mg/dL 0.6  Alkaline Phos 38 - 126 U/L 82  AST 15 - 41 U/L 19  ALT 14 - 54 U/L 18    No results found for: INR, PROTIME  Imaging: No results found.    A/P: Biliary colic.   I recommend proceeding with laparoscopic cholecystectomy. Discussed risks of surgery including bleeding, pain, scarring, intraabdominal injury specifically to the common bile duct and sequelae, conversion to open surgery, failure to resolve symptoms, blood clots/ pulmonary embolus, heart attack, pneumonia, stroke, death. Questions welcomed and answered to patient's satisfaction.    Fredy Gladu, MD [facility] Pager [phone]  

## 2018-06-12 NOTE — H&P (Signed)
Surgical H&P  CC: abdominal pain  HPI: This is a very pleasant and otherwise healthy 22 year old woman who is referred by the emergency department where she presented on June 10 with new onset upper abdominal pain.  The pain has started earlier in the day, it had resolved a little bit with Aleve in a warm bath, but had returned and failed to subside after several hours.  This is associated with nausea but no vomiting or fever.  The pain was in the bilateral upper quadrants.  The patient had continued to worsen, did radiate to her back somewhat into her chest.  Her workup there included a CMP which was normal, as well as a CBC that showed a white count of 11.9 but otherwise unremarkable.  Ultrasound demonstrates gallstones and sludge, no cholecystitis, common bile duct 4 mm.  Hepatic steatosis.  She was discharged.  She has had a couple more bouts of pain somewhat improved with hydrocodone that was prescribed by the ER.  She has previously had a C-section and concomitant tubal ligation but no other abdominal surgeries.  She is a mother to 2 little boy's and currently stays at home with them and does her medical classes online at night.  No tobacco or alcohol abuse.  No Known Allergies  No past medical history on file.  Past Surgical History:  Procedure Laterality Date  . CESAREAN SECTION     x2  . CESAREAN SECTION N/A 05/18/2017   Procedure: REPEAT CESAREAN SECTION;  Surgeon: Willodean RosenthalHarraway-Smith, Carolyn, MD;  [facility];  Service: Obstetrics;  Laterality: N/A;  . TUBAL LIGATION  05/18/2017   Procedure: BILATERAL TUBAL STERILIZATION;  Surgeon: Willodean RosenthalHarraway-Smith, Carolyn, MD;  [facility];  Service: Obstetrics;;    Family History  Problem Relation Age of Onset  . Hypertension Father   . Diabetes Paternal Aunt   . Hypertension Paternal Aunt     Social History   Socioeconomic History  . Marital status: Married    Spouse name: Not on file  . Number of children:  Not on file  . Years of education: Not on file  . Highest education level: Not on file  Occupational History  . Not on file  Social Needs  . Financial resource strain: Not on file  . Food insecurity:    Worry: Not on file    Inability: Not on file  . Transportation needs:    Medical: Not on file    Non-medical: Not on file  Tobacco Use  . Smoking status: Never Smoker  . Smokeless tobacco: Never Used  Substance and Sexual Activity  . Alcohol use: No  . Drug use: No  . Sexual activity: Yes  Lifestyle  . Physical activity:    Days per week: Not on file    Minutes per session: Not on file  . Stress: Not on file  Relationships  . Social connections:    Talks on phone: Not on file    Gets together: Not on file    Attends religious service: Not on file    Active member of club or organization: Not on file    Attends meetings of clubs or organizations: Not on file    Relationship status: Not on file  Other Topics Concern  . Not on file  Social History Narrative  . Not on file    Current Outpatient Medications on File Prior to Visit  Medication Sig Dispense Refill  . acetaminophen (TYLENOL) 500 MG tablet Take 1,000 mg by  mouth daily as needed for mild pain, moderate pain or headache.     . amphetamine-dextroamphetamine (ADDERALL XR) 10 MG 24 hr capsule Take 40 mg by mouth daily.    Marland Kitchen HYDROcodone-acetaminophen (NORCO/VICODIN) 5-325 MG tablet Take 1 tablet by mouth every 4 (four) hours as needed for severe pain. 5 tablet 0  . Norethindrone Acetate-Ethinyl Estrad-FE (LOESTRIN 24 FE) 1-20 MG-MCG(24) tablet Take 1 tablet by mouth daily. 1 Package 11  . ondansetron (ZOFRAN ODT) 8 MG disintegrating tablet 8mg  ODT q4 hours prn nausea 4 tablet 0   No current facility-administered medications on file prior to visit.     Review of Systems: a complete, 10pt review of systems was completed with pertinent positives and negatives as documented in the HPI  Physical Exam: There were no  vitals filed for this visit. Gen: A&Ox3, no distress  Head: normocephalic, atraumatic Eyes: extraocular motions intact, anicteric.  Neck: supple without mass or thyromegaly Chest: unlabored respirations, symmetrical air entry, clear bilaterally   Cardiovascular: RRR with palpable distal pulses, no pedal edema Abdomen: soft, nondistended, nontender. No mass or organomegaly.  Extremities: warm, without edema, no deformities  Neuro: grossly intact Psych: appropriate mood and affect, normal insight  Skin: warm and dry   CBC Latest Ref Rng & Units 05/31/2018 03/02/2018 05/19/2017  WBC 4.0 - 10.5 K/uL 11.9(H) 10.9(H) 11.1(H)  Hemoglobin 12.0 - 15.0 g/dL 16.1 09.6 9.9(L)  Hematocrit 36.0 - 46.0 % 44.1 40.2 31.3(L)  Platelets 150 - 400 K/uL 444(H) 433(H) 205    CMP Latest Ref Rng & Units 05/31/2018  Glucose 65 - 99 mg/dL 96  BUN 6 - 20 mg/dL 6  Creatinine 0.45 - 4.09 mg/dL 8.11  Sodium 914 - 782 mmol/L 138  Potassium 3.5 - 5.1 mmol/L 4.0  Chloride 101 - 111 mmol/L 105  CO2 22 - 32 mmol/L 22  Calcium 8.9 - 10.3 mg/dL 9.0  Total Protein 6.5 - 8.1 g/dL 7.0  Total Bilirubin 0.3 - 1.2 mg/dL 0.6  Alkaline Phos 38 - 126 U/L 82  AST 15 - 41 U/L 19  ALT 14 - 54 U/L 18    No results found for: INR, PROTIME  Imaging: No results found.    A/P: Biliary colic.   I recommend proceeding with laparoscopic cholecystectomy. Discussed risks of surgery including bleeding, pain, scarring, intraabdominal injury specifically to the common bile duct and sequelae, conversion to open surgery, failure to resolve symptoms, blood clots/ pulmonary embolus, heart attack, pneumonia, stroke, death. Questions welcomed and answered to patient's satisfaction.    Phylliss Blakes, MD [facility] [place] Pager [phone]

## 2018-06-16 MED ORDER — BUPIVACAINE LIPOSOME 1.3 % IJ SUSP
20.0000 mL | Freq: Once | INTRAMUSCULAR | Status: DC
  Filled 2018-06-16: qty 20

## 2018-06-17 ENCOUNTER — Encounter (HOSPITAL_COMMUNITY): Payer: Self-pay | Admitting: Emergency Medicine

## 2018-06-17 ENCOUNTER — Encounter (HOSPITAL_COMMUNITY)
Admission: RE | Disposition: A | Discharge status: Home / Self Care | Payer: Self-pay | Source: Ambulatory Visit | Attending: Surgery

## 2018-06-17 ENCOUNTER — Other Ambulatory Visit: Payer: Self-pay

## 2018-06-17 ENCOUNTER — Ambulatory Visit (HOSPITAL_COMMUNITY): Payer: Medicaid Other | Admitting: Anesthesiology

## 2018-06-17 ENCOUNTER — Ambulatory Visit (HOSPITAL_COMMUNITY)
Admission: RE | Admit: 2018-06-17 | Discharge: 2018-06-17 | Disposition: A | Discharge status: Home / Self Care | Payer: Medicaid Other | Source: Ambulatory Visit | Attending: Surgery | Admitting: Surgery

## 2018-06-17 DIAGNOSIS — Z6841 Body Mass Index (BMI) 40.0 and over, adult: Secondary | ICD-10-CM | POA: Diagnosis not present

## 2018-06-17 DIAGNOSIS — K8050 Calculus of bile duct without cholangitis or cholecystitis without obstruction: Secondary | ICD-10-CM | POA: Diagnosis present

## 2018-06-17 DIAGNOSIS — K8010 Calculus of gallbladder with chronic cholecystitis without obstruction: Secondary | ICD-10-CM | POA: Insufficient documentation

## 2018-06-17 DIAGNOSIS — K760 Fatty (change of) liver, not elsewhere classified: Secondary | ICD-10-CM | POA: Insufficient documentation

## 2018-06-17 DIAGNOSIS — Z79899 Other long term (current) drug therapy: Secondary | ICD-10-CM | POA: Diagnosis not present

## 2018-06-17 HISTORY — DX: Attention-deficit hyperactivity disorder, unspecified type: F90.9

## 2018-06-17 HISTORY — PX: CHOLECYSTECTOMY: SHX55

## 2018-06-17 LAB — HCG, SERUM, QUALITATIVE: Preg, Serum: NEGATIVE

## 2018-06-17 SURGERY — LAPAROSCOPIC CHOLECYSTECTOMY
Anesthesia: General

## 2018-06-17 MED ORDER — MIDAZOLAM HCL 2 MG/2ML IJ SOLN
INTRAMUSCULAR | Status: AC
  Filled 2018-06-17: qty 2

## 2018-06-17 MED ORDER — MIDAZOLAM HCL 5 MG/5ML IJ SOLN
INTRAMUSCULAR | Status: DC | PRN
  Administered 2018-06-17: 2 mg via INTRAVENOUS

## 2018-06-17 MED ORDER — PROPOFOL 10 MG/ML IV BOLUS
INTRAVENOUS | Status: DC | PRN
  Administered 2018-06-17: 200 mg via INTRAVENOUS

## 2018-06-17 MED ORDER — CELECOXIB 200 MG PO CAPS
200.0000 mg | ORAL_CAPSULE | ORAL | Status: AC
  Administered 2018-06-17: 200 mg via ORAL
  Filled 2018-06-17: qty 1

## 2018-06-17 MED ORDER — CHLORHEXIDINE GLUCONATE 4 % EX LIQD: 60.0000 mL | Freq: Once | CUTANEOUS | Status: DC

## 2018-06-17 MED ORDER — LIDOCAINE 20MG/ML (2%) 15 ML SYRINGE OPTIME
INTRAMUSCULAR | Status: DC | PRN
  Administered 2018-06-17: 1.5 mg/kg/h via INTRAVENOUS

## 2018-06-17 MED ORDER — GABAPENTIN 300 MG PO CAPS
300.0000 mg | ORAL_CAPSULE | ORAL | Status: AC
  Administered 2018-06-17: 300 mg via ORAL
  Filled 2018-06-17: qty 1

## 2018-06-17 MED ORDER — ONDANSETRON HCL 4 MG/2ML IJ SOLN
INTRAMUSCULAR | Status: DC | PRN
  Administered 2018-06-17: 4 mg via INTRAVENOUS

## 2018-06-17 MED ORDER — SUGAMMADEX SODIUM 200 MG/2ML IV SOLN
INTRAVENOUS | Status: DC | PRN
  Administered 2018-06-17: 200 mg via INTRAVENOUS

## 2018-06-17 MED ORDER — HYDROMORPHONE HCL 1 MG/ML IJ SOLN
0.2500 mg | INTRAMUSCULAR | Status: DC | PRN
  Administered 2018-06-17 (×3): 0.5 mg via INTRAVENOUS

## 2018-06-17 MED ORDER — ROCURONIUM BROMIDE 100 MG/10ML IV SOLN
INTRAVENOUS | Status: DC | PRN
  Administered 2018-06-17: 10 mg via INTRAVENOUS
  Administered 2018-06-17: 30 mg via INTRAVENOUS

## 2018-06-17 MED ORDER — BUPIVACAINE-EPINEPHRINE (PF) 0.25% -1:200000 IJ SOLN
INTRAMUSCULAR | Status: AC
  Filled 2018-06-17: qty 30

## 2018-06-17 MED ORDER — ROCURONIUM BROMIDE 100 MG/10ML IV SOLN
INTRAVENOUS | Status: AC
  Filled 2018-06-17: qty 1

## 2018-06-17 MED ORDER — ONDANSETRON HCL 4 MG/2ML IJ SOLN
INTRAMUSCULAR | Status: AC
  Filled 2018-06-17: qty 2

## 2018-06-17 MED ORDER — LACTATED RINGERS IR SOLN
Status: DC | PRN
  Administered 2018-06-17: 1000 mL

## 2018-06-17 MED ORDER — CEFAZOLIN SODIUM-DEXTROSE 2-4 GM/100ML-% IV SOLN
2.0000 g | INTRAVENOUS | Status: AC
  Administered 2018-06-17: 2 g via INTRAVENOUS
  Filled 2018-06-17: qty 100

## 2018-06-17 MED ORDER — LIDOCAINE HCL (CARDIAC) PF 100 MG/5ML IV SOSY
PREFILLED_SYRINGE | INTRAVENOUS | Status: DC | PRN
  Administered 2018-06-17: 60 mg via INTRAVENOUS

## 2018-06-17 MED ORDER — DEXAMETHASONE SODIUM PHOSPHATE 10 MG/ML IJ SOLN
INTRAMUSCULAR | Status: DC | PRN
  Administered 2018-06-17: 10 mg via INTRAVENOUS

## 2018-06-17 MED ORDER — SUGAMMADEX SODIUM 200 MG/2ML IV SOLN
INTRAVENOUS | Status: AC
  Filled 2018-06-17: qty 2

## 2018-06-17 MED ORDER — HYDROCODONE-ACETAMINOPHEN 7.5-325 MG PO TABS
1.0000 | ORAL_TABLET | Freq: Once | ORAL | Status: DC | PRN
Start: 2018-06-17 — End: 2018-06-17

## 2018-06-17 MED ORDER — HYDROCODONE-ACETAMINOPHEN 5-325 MG PO TABS: 1.0000 | ORAL_TABLET | Freq: Four times a day (QID) | ORAL | 0 refills | Status: AC | PRN

## 2018-06-17 MED ORDER — LIDOCAINE HCL 2 % IJ SOLN
INTRAMUSCULAR | Status: AC
  Filled 2018-06-17: qty 20

## 2018-06-17 MED ORDER — HYDROMORPHONE HCL 1 MG/ML IJ SOLN
INTRAMUSCULAR | Status: AC
  Administered 2018-06-17: 0.5 mg via INTRAVENOUS
  Filled 2018-06-17: qty 2

## 2018-06-17 MED ORDER — SUCCINYLCHOLINE CHLORIDE 20 MG/ML IJ SOLN
INTRAMUSCULAR | Status: DC | PRN
  Administered 2018-06-17: 120 mg via INTRAVENOUS

## 2018-06-17 MED ORDER — 0.9 % SODIUM CHLORIDE (POUR BTL) OPTIME
TOPICAL | Status: DC | PRN
  Administered 2018-06-17: 1000 mL

## 2018-06-17 MED ORDER — LACTATED RINGERS IV SOLN
INTRAVENOUS | Status: DC
  Administered 2018-06-17 (×2): via INTRAVENOUS

## 2018-06-17 MED ORDER — PROPOFOL 10 MG/ML IV BOLUS
INTRAVENOUS | Status: AC
  Filled 2018-06-17: qty 20

## 2018-06-17 MED ORDER — FENTANYL CITRATE (PF) 100 MCG/2ML IJ SOLN
INTRAMUSCULAR | Status: DC | PRN
  Administered 2018-06-17: 50 ug via INTRAVENOUS
  Administered 2018-06-17: 100 ug via INTRAVENOUS

## 2018-06-17 MED ORDER — SCOPOLAMINE 1 MG/3DAYS TD PT72
1.0000 | MEDICATED_PATCH | Freq: Once | TRANSDERMAL | Status: DC
  Administered 2018-06-17: 1.5 mg via TRANSDERMAL
  Filled 2018-06-17: qty 1

## 2018-06-17 MED ORDER — FENTANYL CITRATE (PF) 250 MCG/5ML IJ SOLN
INTRAMUSCULAR | Status: AC
  Filled 2018-06-17: qty 5

## 2018-06-17 MED ORDER — DOCUSATE SODIUM 100 MG PO CAPS: 100.0000 mg | ORAL_CAPSULE | Freq: Two times a day (BID) | ORAL | 0 refills | Status: AC

## 2018-06-17 MED ORDER — BUPIVACAINE-EPINEPHRINE 0.25% -1:200000 IJ SOLN
INTRAMUSCULAR | Status: DC | PRN
  Administered 2018-06-17: 30 mL

## 2018-06-17 MED ORDER — ACETAMINOPHEN 500 MG PO TABS
1000.0000 mg | ORAL_TABLET | ORAL | Status: AC
  Administered 2018-06-17: 1000 mg via ORAL
  Filled 2018-06-17: qty 2

## 2018-06-17 MED ORDER — LIDOCAINE 20MG/ML (2%) 15 ML SYRINGE OPTIME: INTRAMUSCULAR | Status: DC | PRN

## 2018-06-17 MED ORDER — METOCLOPRAMIDE HCL 5 MG/ML IJ SOLN: 10.0000 mg | Freq: Once | INTRAMUSCULAR | Status: DC | PRN

## 2018-06-17 MED ORDER — MEPERIDINE HCL 50 MG/ML IJ SOLN: 6.2500 mg | INTRAMUSCULAR | Status: DC | PRN

## 2018-06-17 SURGICAL SUPPLY — 37 items
APPLIER CLIP ROT 10 11.4 M/L (STAPLE) ×3
BANDAGE ADH SHEER 1  50/CT (GAUZE/BANDAGES/DRESSINGS) ×12 IMPLANT
BENZOIN TINCTURE PRP APPL 2/3 (GAUZE/BANDAGES/DRESSINGS) ×3 IMPLANT
CABLE HIGH FREQUENCY MONO STRZ (ELECTRODE) ×3 IMPLANT
CHLORAPREP W/TINT 26ML (MISCELLANEOUS) ×3 IMPLANT
CLIP APPLIE ROT 10 11.4 M/L (STAPLE) ×1 IMPLANT
CLOSURE WOUND 1/2 X4 (GAUZE/BANDAGES/DRESSINGS) ×1
COVER MAYO STAND STRL (DRAPES) IMPLANT
COVER SURGICAL LIGHT HANDLE (MISCELLANEOUS) ×3 IMPLANT
DECANTER SPIKE VIAL GLASS SM (MISCELLANEOUS) ×3 IMPLANT
DERMABOND ADVANCED (GAUZE/BANDAGES/DRESSINGS)
DERMABOND ADVANCED .7 DNX12 (GAUZE/BANDAGES/DRESSINGS) IMPLANT
DRAPE C-ARM 42X120 X-RAY (DRAPES) IMPLANT
DRAPE LAPAROSCOPIC ABDOMINAL (DRAPES) ×3 IMPLANT
ELECT REM PT RETURN 15FT ADLT (MISCELLANEOUS) ×3 IMPLANT
GLOVE BIO SURGEON STRL SZ 6 (GLOVE) ×3 IMPLANT
GLOVE INDICATOR 6.5 STRL GRN (GLOVE) ×3 IMPLANT
GOWN STRL REUS W/TWL LRG LVL3 (GOWN DISPOSABLE) ×3 IMPLANT
GOWN STRL REUS W/TWL XL LVL3 (GOWN DISPOSABLE) ×6 IMPLANT
GRASPER SUT TROCAR 14GX15 (MISCELLANEOUS) ×3 IMPLANT
HEMOSTAT SNOW SURGICEL 2X4 (HEMOSTASIS) IMPLANT
HOVERMATT SINGLE USE (MISCELLANEOUS) ×3 IMPLANT
KIT BASIN OR (CUSTOM PROCEDURE TRAY) ×3 IMPLANT
NEEDLE INSUFFLATION 14GA 120MM (NEEDLE) ×3 IMPLANT
POUCH SPECIMEN RETRIEVAL 10MM (ENDOMECHANICALS) ×3 IMPLANT
SCISSORS LAP 5X35 DISP (ENDOMECHANICALS) ×3 IMPLANT
SET CHOLANGIOGRAPH MIX (MISCELLANEOUS) IMPLANT
SET IRRIG TUBING LAPAROSCOPIC (IRRIGATION / IRRIGATOR) ×3 IMPLANT
SLEEVE XCEL OPT CAN 5 100 (ENDOMECHANICALS) ×6 IMPLANT
STRIP CLOSURE SKIN 1/2X4 (GAUZE/BANDAGES/DRESSINGS) ×2 IMPLANT
SUT MNCRL AB 4-0 PS2 18 (SUTURE) ×3 IMPLANT
TOWEL OR 17X26 10 PK STRL BLUE (TOWEL DISPOSABLE) ×3 IMPLANT
TOWEL OR NON WOVEN STRL DISP B (DISPOSABLE) IMPLANT
TRAY LAPAROSCOPIC (CUSTOM PROCEDURE TRAY) ×3 IMPLANT
TROCAR BLADELESS OPT 5 100 (ENDOMECHANICALS) ×3 IMPLANT
TROCAR XCEL 12X100 BLDLESS (ENDOMECHANICALS) ×3 IMPLANT
TUBING INSUF HEATED (TUBING) ×3 IMPLANT

## 2018-06-17 NOTE — Anesthesia Preprocedure Evaluation (Addendum)
Anesthesia Evaluation  Patient identified by MRN, date of birth, ID band Patient awake    Reviewed: Allergy & Precautions, NPO status , Patient's Chart, lab work & pertinent test results  Airway Mallampati: III  TM Distance: >3 FB Neck ROM: Full    Dental no notable dental hx. (+) Teeth Intact   Pulmonary neg pulmonary ROS,    Pulmonary exam normal breath sounds clear to auscultation       Cardiovascular negative cardio ROS Normal cardiovascular exam Rhythm:Regular Rate:Normal     Neuro/Psych PSYCHIATRIC DISORDERS ADHDnegative neurological ROS     GI/Hepatic negative GI ROS, Neg liver ROS, Symptomatic cholelithiasis   Endo/Other  Morbid obesity  Renal/GU negative Renal ROS  negative genitourinary   Musculoskeletal negative musculoskeletal ROS (+)   Abdominal (+) + obese,   Peds  Hematology negative hematology ROS (+)   Anesthesia Other Findings   Reproductive/Obstetrics                            Anesthesia Physical Anesthesia Plan  ASA: III  Anesthesia Plan: General   Post-op Pain Management:    Induction: Intravenous, Rapid sequence and Cricoid pressure planned  PONV Risk Score and Plan: 4 or greater and Scopolamine patch - Pre-op, Midazolam, Dexamethasone, Ondansetron and Treatment may vary due to age or medical condition  Airway Management Planned: Oral ETT  Additional Equipment:   Intra-op Plan:   Post-operative Plan: Extubation in OR  Informed Consent: I have reviewed the patients History and Physical, chart, labs and discussed the procedure including the risks, benefits and alternatives for the proposed anesthesia with the patient or authorized representative who has indicated his/her understanding and acceptance.   Dental advisory given  Plan Discussed with: CRNA and Surgeon  Anesthesia Plan Comments:         Anesthesia Quick Evaluation

## 2018-06-17 NOTE — Anesthesia Postprocedure Evaluation (Signed)
Anesthesia Post Note  Patient: Kaitlyn Irwin  Procedure(s) Performed: LAPAROSCOPIC CHOLECYSTECTOMY (N/A )     Patient location during evaluation: PACU Anesthesia Type: General Level of consciousness: awake and alert Pain management: pain level controlled Vital Signs Assessment: post-procedure vital signs reviewed and stable Respiratory status: spontaneous breathing, nonlabored ventilation, respiratory function stable and patient connected to nasal cannula oxygen Cardiovascular status: blood pressure returned to baseline and stable Postop Assessment: no apparent nausea or vomiting Anesthetic complications: no    Last Vitals:  Vitals:   06/17/18 1200 06/17/18 1250  BP: 121/67 131/83  Pulse:  92  Resp: 18 16  Temp: 36.7 C   SpO2: 97% 96%    Last Pain:  Vitals:   06/17/18 1250  TempSrc:   PainSc: 0-No pain                 Dagen Beevers

## 2018-06-17 NOTE — Interval H&P Note (Signed)
History and Physical Interval Note:  06/17/2018 9:04 AM  Kaitlyn Irwin  has presented today for surgery, with the diagnosis of BILIARY COLIC  The various methods of treatment have been discussed with the patient and family. After consideration of risks, benefits and other options for treatment, the patient has consented to  Procedure(s): LAPAROSCOPIC CHOLECYSTECTOMY (N/A) as a surgical intervention .  The patient's history has been reviewed, patient examined, no change in status, stable for surgery.  I have reviewed the patient's chart and labs.  Questions were answered to the patient's satisfaction.     Kaitlyn Irwin

## 2018-06-17 NOTE — Discharge Instructions (Signed)
LAPAROSCOPIC SURGERY: POST OP INSTRUCTIONS  ######################################################################  EAT Gradually transition to a high fiber diet with a fiber supplement over the next few weeks after discharge.  Start with a pureed / full liquid diet (see below)  WALK Walk an hour a day.  Control your pain to do that.    CONTROL PAIN Control pain so that you can walk, sleep, tolerate sneezing/coughing, go up/down stairs.  HAVE A BOWEL MOVEMENT DAILY Keep your bowels regular to avoid problems.  OK to try a laxative to override constipation.  OK to use an antidairrheal to slow down diarrhea.  Call if not better after 2 tries  CALL IF YOU HAVE PROBLEMS/CONCERNS Call if you are still struggling despite following these instructions. Call if you have concerns not answered by these instructions  ######################################################################    1. DIET: Follow a light bland diet the first 24 hours after arrival home, such as soup, liquids, crackers, etc.  Be sure to include lots of fluids daily.  Avoid fast food or heavy meals as your are more likely to get nauseated.  Eat a low fat the next few days after surgery.   2. Take your usually prescribed home medications unless otherwise directed. 3. PAIN CONTROL: a. Pain is best controlled by a usual combination of three different methods TOGETHER: i. Ice/Heat ii. Over the counter pain medication iii. Prescription pain medication b. Most patients will experience some swelling and bruising around the incisions.  Ice packs or heating pads (30-60 minutes up to 6 times a day) will help. Use ice for the first few days to help decrease swelling and bruising, then switch to heat to help relax tight/sore spots and speed recovery.  Some people prefer to use ice alone, heat alone, alternating between ice & heat.  Experiment to what works for you.  Swelling and bruising can take several weeks to resolve.   c. It is  helpful to take an over-the-counter pain medication regularly for the first few weeks.  Choose one of the following that works best for you: i. Naproxen (Aleve, etc)  Two 251m tabs twice a day ii. Ibuprofen (Advil, etc) Three 2053mtabs four times a day (every meal & bedtime) iii. Acetaminophen (Tylenol, etc) 500-65044mour times a day (every meal & bedtime) d. A  prescription for pain medication (such as oxycodone, hydrocodone, etc) should be given to you upon discharge.  Take your pain medication as prescribed.  i. If you are having problems/concerns with the prescription medicine (does not control pain, nausea, vomiting, rash, itching, etc), please call us [phone] see if we need to switch you to a different pain medicine that will work better for you and/or control your side effect better. ii. If you need a refill on your pain medication, please contact your pharmacy.  They will contact our office to request authorization. Prescriptions will not be filled after 5 pm or on week-ends. 4. Avoid getting constipated.  Between the surgery and the pain medications, it is common to experience some constipation.  Increasing fluid intake and taking a fiber supplement (such as Metamucil, Citrucel, FiberCon, MiraLax, etc) 1-2 times a day regularly will usually help prevent this problem from occurring.  A mild laxative (prune juice, Milk of Magnesia, MiraLax, etc) should be taken according to package directions if there are no bowel movements after 48 hours.   5. Watch out for diarrhea.  If you have many loose bowel movements, simplify your diet to bland foods & liquids for  a few days.  Stop any stool softeners and decrease your fiber supplement.  Switching to mild anti-diarrheal medications (Kayopectate, Pepto Bismol) can help.  If this worsens or does not improve, please call us. 6. Wash / shower every day starting on post-op day 2.  You may shower over the dressings as they are waterproof.  Continue to  shower over incision(s) after the dressing is off. 7. Remove bandaids after 2 days. Steri strips will peel off after 1-2 weeks.  You may leave the incision open to air.  You may replace a dressing/Band-Aid to cover the incision for comfort if you wish.  8. ACTIVITIES as tolerated:   a. You may resume regular (light) daily activities beginning the next day--such as daily self-care, walking, climbing stairs--gradually increasing activities as tolerated.  If you can walk 30 minutes without difficulty, it is safe to try more intense activity such as jogging, treadmill, bicycling, low-impact aerobics, swimming, etc. b. Save the most intensive and strenuous activity for last such as sit-ups, heavy lifting, contact sports, etc  Refrain from any heavy lifting or straining until you are off narcotics for pain control.   c. DO NOT PUSH THROUGH PAIN.  Let pain be your guide: If it hurts to do something, don't do it.  Pain is your body warning you to avoid that activity for another week until the pain goes down. d. You may drive when you are no longer taking prescription pain medication, you can comfortably wear a seatbelt, and you can safely maneuver your car and apply brakes. e. Bonita QuinYou may have sexual intercourse when it is comfortable.  9. FOLLOW UP in our office a. Please call [facility] at [phone] to set up an appointment to see your surgeon in the office for a follow-up appointment approximately 2-3 weeks after your surgery. b. Make sure that you call for this appointment the day you arrive home to insure a convenient appointment time. 10. IF YOU HAVE DISABILITY OR FAMILY LEAVE FORMS, BRING THEM TO THE OFFICE FOR PROCESSING.  DO NOT GIVE THEM TO YOUR DOCTOR.   WHEN TO CALL US [phone]: 1. Poor pain control 2. Reactions / problems with new medications (rash/itching, nausea, etc)  3. Fever over 101.5 F (38.5 C) 4. Inability to urinate 5. Nausea and/or vomiting 6. Worsening swelling or  bruising 7. Continued bleeding from incision. 8. Increased pain, redness, or drainage from the incision   The clinic staff is available to answer your questions during regular business hours (8:30am-5pm).  Please dont hesitate to call and ask to speak to one of our nurses for clinical concerns.   If you have a medical emergency, go to the nearest emergency room or call 911.  [facility] from [facility] is always on call at the [facility]   [facility], [place] [address], [address], [place], [place]  [id number] ? MAIN: [phone] ? TOLL FREE: [phone] ?  FAX [phone] [email or web address]  General Anesthesia, Adult, Care After These instructions provide you with information about caring for yourself after your procedure. Your health care provider may also give you more specific instructions. Your treatment has been planned according to current medical practices, but problems sometimes occur. Call your health care provider if you have any problems or questions after your procedure. What can I expect after the procedure? After the procedure, it is common to have:  Vomiting.  A sore throat.  Mental slowness.  It is common to feel:  Nauseous.  Cold or shivery.  Sleepy.  Tired.  Sore or achy, even in parts of your body where you did not have surgery.  Follow these instructions at home: For at least 24 hours after the procedure:  Do not: ? Participate in activities where you could fall or become injured. ? Drive. ? Use heavy machinery. ? Drink alcohol. ? Take sleeping pills or medicines that cause drowsiness. ? Make important decisions or sign legal documents. ? Take care of children on your own.  Rest. Eating and drinking  If you vomit, drink water, juice, or soup when you can drink without vomiting.  Drink enough fluid to keep your urine clear or pale yellow.  Make sure you have little or no nausea before eating  solid foods.  Follow the diet recommended by your health care provider. General instructions  Have a responsible adult stay with you until you are awake and alert.  Return to your normal activities as told by your health care provider. Ask your health care provider what activities are safe for you.  Take over-the-counter and prescription medicines only as told by your health care provider.  If you smoke, do not smoke without supervision.  Keep all follow-up visits as told by your health care provider. This is important. Contact a health care provider if:  You continue to have nausea or vomiting at home, and medicines are not helpful.  You cannot drink fluids or start eating again.  You cannot urinate after 8-12 hours.  You develop a skin rash.  You have fever.  You have increasing redness at the site of your procedure. Get help right away if:  You have difficulty breathing.  You have chest pain.  You have unexpected bleeding.  You feel that you are having a life-threatening or urgent problem. This information is not intended to replace advice given to you by your health care provider. Make sure you discuss any questions you have with your health care provider. Document Released: 03/17/2001 Document Revised: 05/13/2016 Document Reviewed: 11/23/2015 Elsevier Interactive Patient Education  [employer].

## 2018-06-17 NOTE — Op Note (Signed)
Operative Note  Kaitlyn Irwin 22 y.o. female [id number]  06/17/2018  Surgeon: Berna Buehelsea A Delno Blaisdell MD  Assistant: none  Procedure performed: Laparoscopic Cholecystectomy  Preop diagnosis: biliary colic Post-op diagnosis/intraop findings: same  Specimens: gallbladder  EBL: minimal  Complications: none  Description of procedure: After obtaining informed consent the patient was brought to the operating room. Prophylactic antibiotics and subcutaneous heparin were administered. SCD's were applied. General endotracheal anesthesia was initiated and a formal time-out was performed. The abdomen was prepped and draped in the usual sterile fashion and the abdomen was entered using a supra-umbilical veress needle after instilling the site with local. Insufflation to 15mmHg was obtained, 5mm trocar and camera inserted and gross inspection revealed no evidence of injury from our entry or other intraabdominal abnormalities. Two 5mm trocars were introduced in the right midclavicular and right anterior axillary lines under direct visualization and following infiltration with local. An 11mm trocar was placed in the epigastrium. The gallbladder was retracted cephalad and the infundibulum was retracted laterally. A combination of hook electrocautery and blunt dissection was utilized to clear the peritoneum from the neck and cystic duct, circumferentially isolating the cystic artery and cystic duct and lifting the gallbladder from the cystic plate. The critical view of safety was achieved with the cystic artery, cystic duct, and liver bed visualized between them with no other structures. The artery was clipped with a single clip proximally and distally and divided as was the cystic duct with three clips on the proximal end. The gallbladder was dissected from the liver plate using electrocautery. Once freed the gallbladder was placed in an endocatch bag and removed through the epigastric trocar site. A small amount of  bleeding on the liver bed was controlled with cautery. Some bile had been spilled from the gallbladder during its dissection from the liver bed. This was aspirated and the right upper quadrant was irrigated copiously until the effluent was clear. Hemostasis was once again confirmed, and reinspection of the abdomen revealed no injuries. The clips were well opposed without any bile leak from the duct or the liver bed. The 11mm trocar site in the epigastrium was closed with a 0 vicryl in the fascia under direct visualization using a PMI device. The abdomen was desufflated and all trocars removed. The skin incisions were closed with running subcuticular monocryl. Benzoin, steri strips and bandaids were applied The patient was awakened, extubated and transported to the recovery room in stable condition.   All counts were correct at the completion of the case.

## 2018-06-17 NOTE — Transfer of Care (Signed)
Immediate Anesthesia Transfer of Care Note  Patient: Kaitlyn Irwin  Procedure(s) Performed: LAPAROSCOPIC CHOLECYSTECTOMY (N/A )  Patient Location: PACU  Anesthesia Type:General  Level of Consciousness: drowsy, patient cooperative and responds to stimulation  Airway & Oxygen Therapy: Patient Spontanous Breathing and Patient connected to face mask oxygen  Post-op Assessment: Report given to RN and Post -op Vital signs reviewed and stable  Post vital signs: Reviewed and stable  Last Vitals:  Vitals Value Taken Time  BP 139/67 06/17/2018 11:03 AM  Temp    Pulse 91 06/17/2018 11:04 AM  Resp 19 06/17/2018 11:04 AM  SpO2 100 % 06/17/2018 11:04 AM  Vitals shown include unvalidated device data.  Last Pain:  Vitals:   06/17/18 0729  TempSrc:   PainSc: 0-No pain      Patients Stated Pain Goal: 4 (06/17/18 0729)  Complications: No apparent anesthesia complications

## 2018-06-17 NOTE — Anesthesia Procedure Notes (Signed)
Procedure Name: Intubation Date/Time: 06/17/2018 9:55 AM Performed by: Thornell MuleStubblefield, Moritz Lever G, CRNA Pre-anesthesia Checklist: Patient identified, Emergency Drugs available, Suction available and Patient being monitored Patient Re-evaluated:Patient Re-evaluated prior to induction Oxygen Delivery Method: Circle system utilized Preoxygenation: Pre-oxygenation with 100% oxygen Induction Type: IV induction Ventilation: Mask ventilation without difficulty Laryngoscope Size: Miller and 3 Grade View: Grade I Tube type: Oral Tube size: 7.0 mm Number of attempts: 1 Airway Equipment and Method: Stylet and Oral airway Placement Confirmation: ETT inserted through vocal cords under direct vision,  positive ETCO2 and breath sounds checked- equal and bilateral Secured at: 20 cm Tube secured with: Tape Dental Injury: Teeth and Oropharynx as per pre-operative assessment

## 2018-08-11 ENCOUNTER — Ambulatory Visit: Payer: Medicaid Other | Admitting: Neurology

## 2018-08-11 ENCOUNTER — Telehealth: Payer: Self-pay | Admitting: *Deleted

## 2018-08-11 NOTE — Telephone Encounter (Signed)
No showed new patient appointment. 

## 2018-08-12 ENCOUNTER — Encounter: Payer: Self-pay | Admitting: Neurology

## 2019-04-14 IMAGING — US US ABDOMEN LIMITED
1 series · 14 of 25 positions shown · non-contrast
Comparison: None.

CLINICAL DATA: Right upper quadrant pain.

EXAM:
ULTRASOUND ABDOMEN LIMITED RIGHT UPPER QUADRANT

[Series 1: us abdomen limited · 0.25mm/px · 14 of 49 slices shown]
[im 1/49]
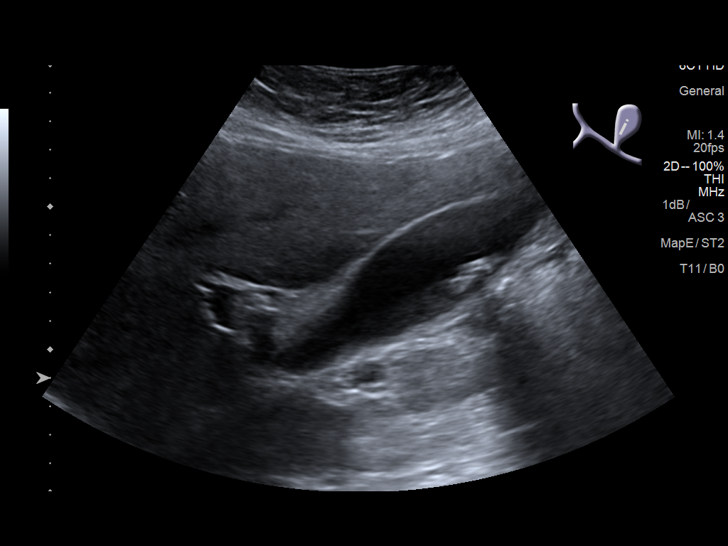
[im 5/49]
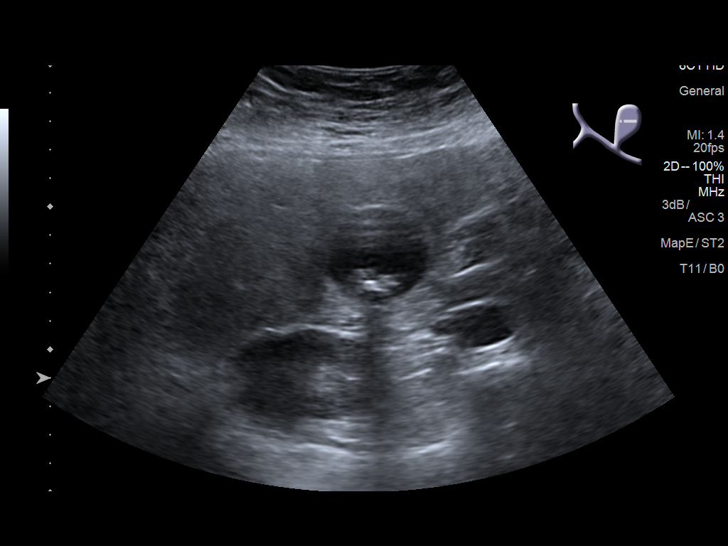
[im 9/49]
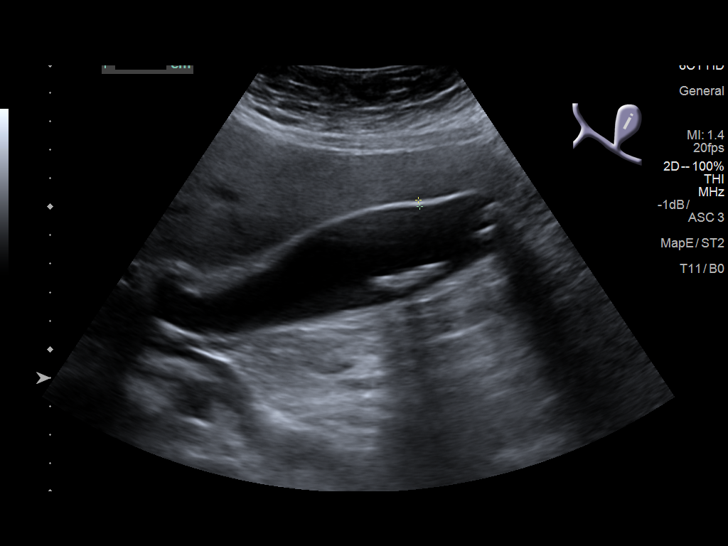
[im 13/49]
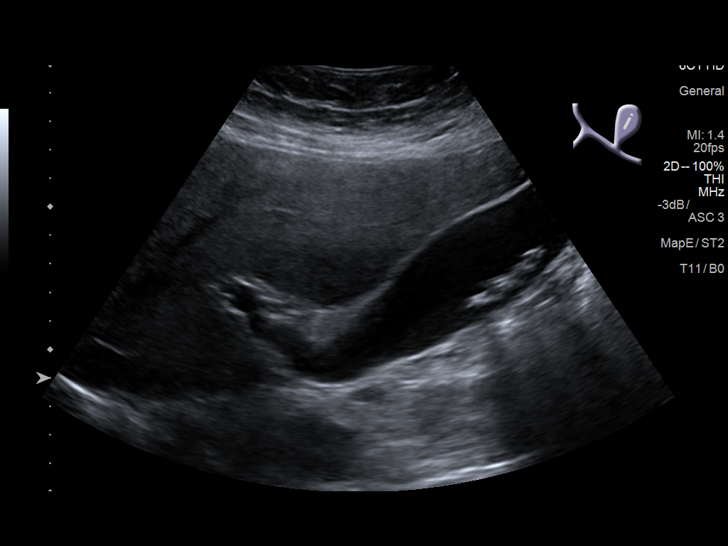
[im 17/49]
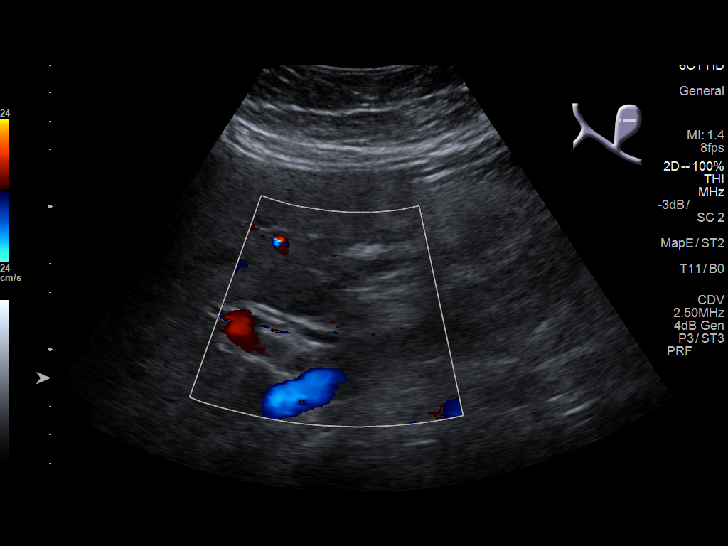
[im 19/49]
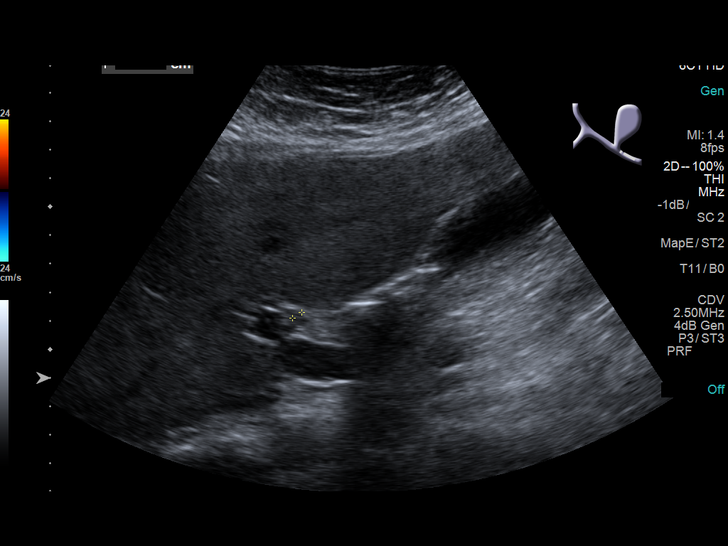
[im 23/49]
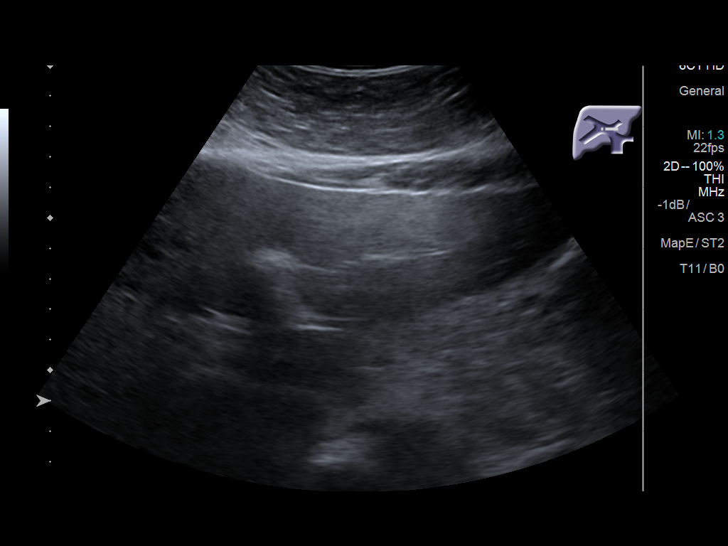
[im 27/49]
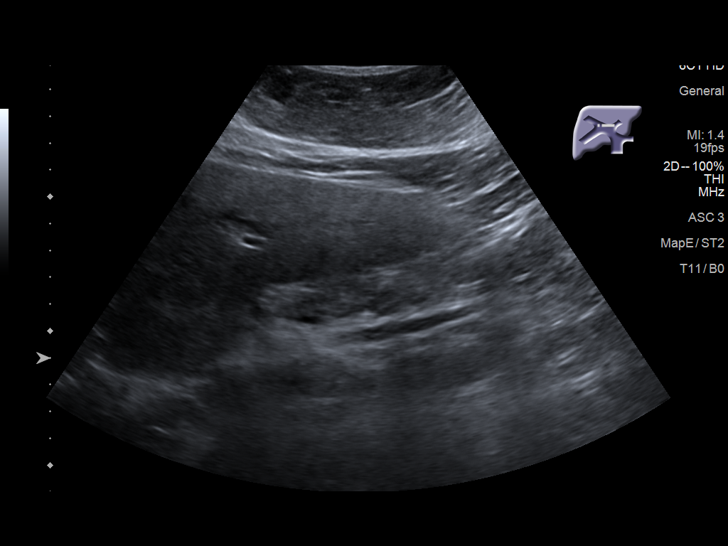
[im 31/49]
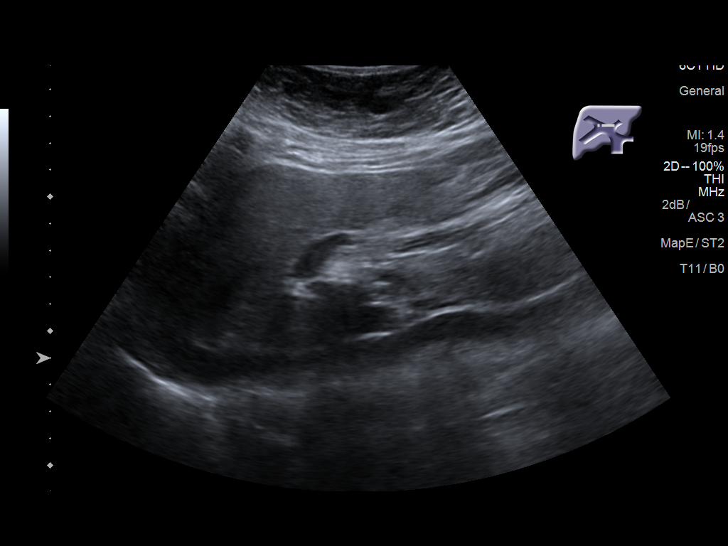
[im 33/49]
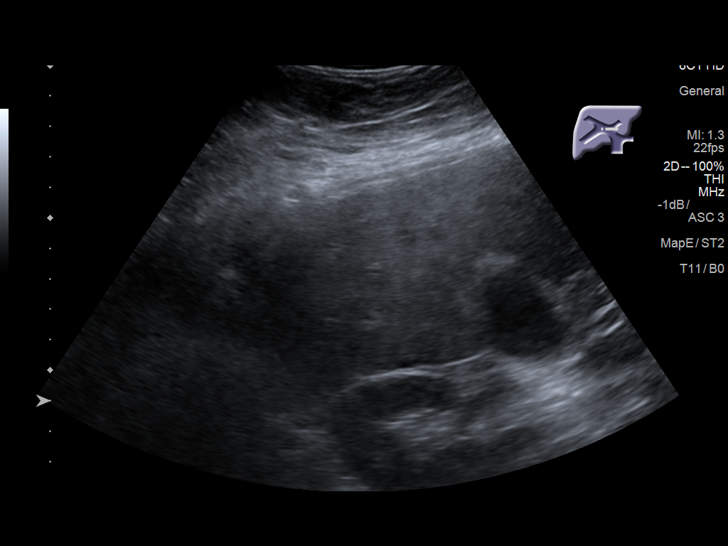
[im 37/49]
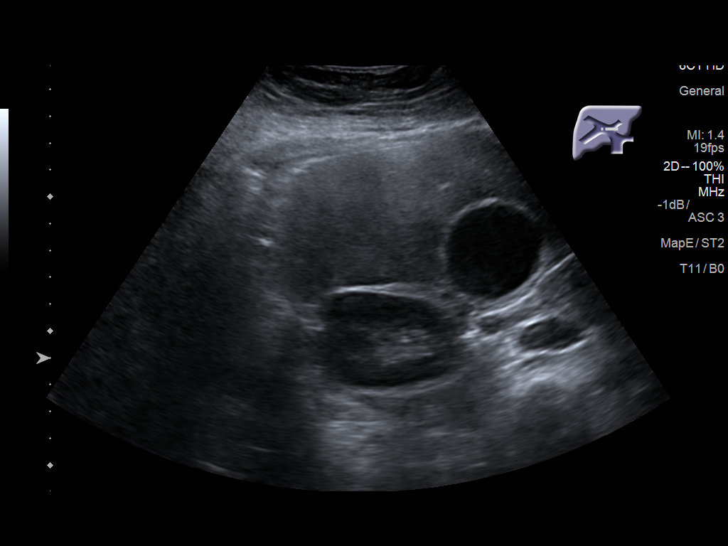
[im 41/49]
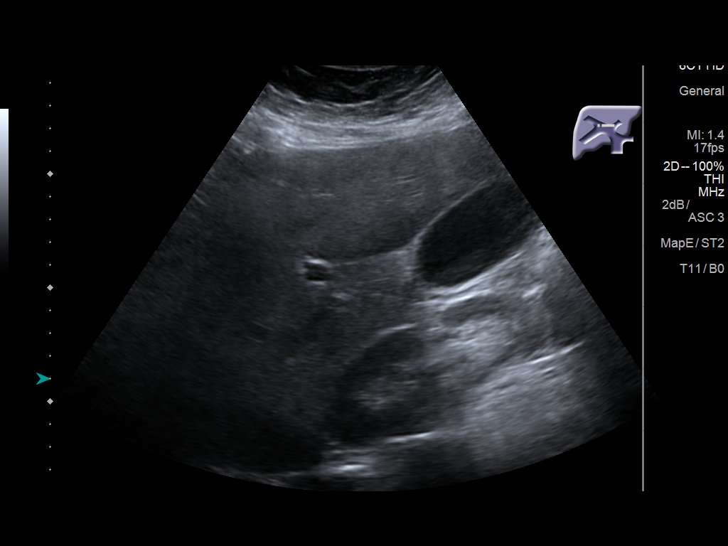
[im 45/49]
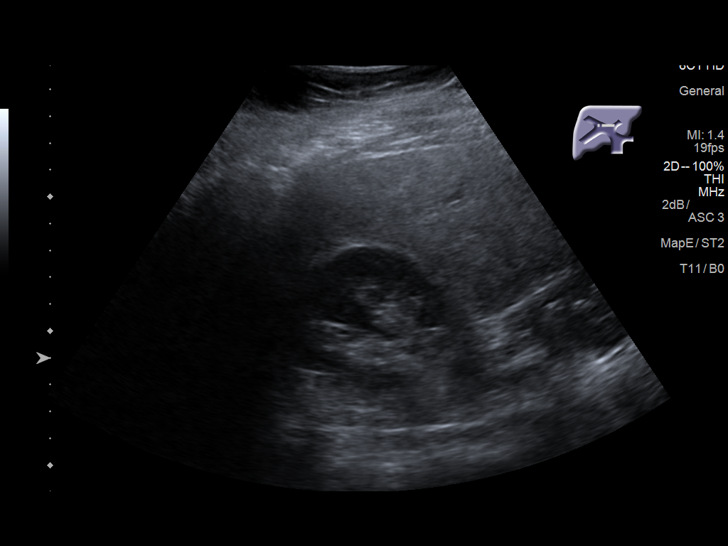
[im 49/49]
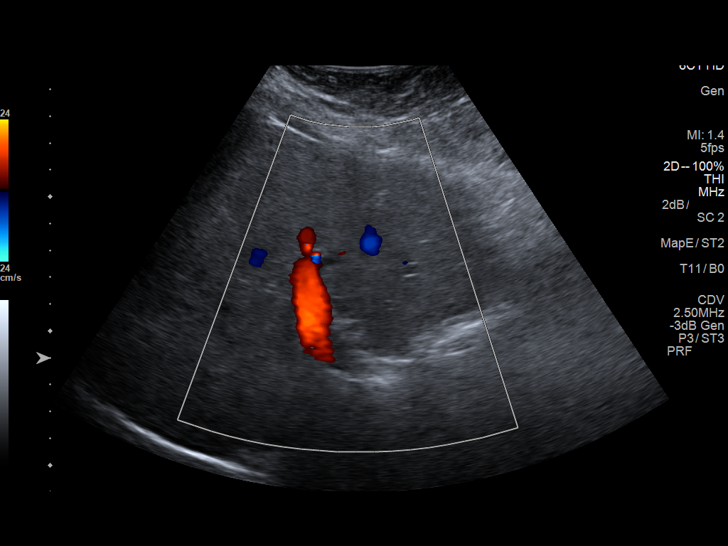

[14 of 25 positions shown; findings below may reference images not displayed]

FINDINGS: Gallbladder:

Physiologically distended containing intraluminal stones and sludge.
No gallbladder wall thickening or pericholecystic fluid. No
sonographic Murphy sign noted by sonographer.

Common bile duct:

Diameter: 4 mm, normal.

Liver:

No focal lesion identified. Diffusely increased in parenchymal
echogenicity. Portal vein is patent on color Doppler imaging with
normal direction of blood flow towards the liver.
IMPRESSION: 1. Gallstones and sludge without sonographic findings of acute
cholecystitis. No biliary dilatation.
2. Hepatic steatosis.
